# Patient Record
Sex: Female | Born: 1935 | Race: White | Hispanic: No | Marital: Married | Smoking: Never smoker
Health system: Southern US, Community
[De-identification: ages and names within clinical notes are randomized; demographics above are authoritative.]

## PROBLEM LIST (undated history)

## (undated) DIAGNOSIS — I10 Essential (primary) hypertension: Secondary | ICD-10-CM

## (undated) DIAGNOSIS — E079 Disorder of thyroid, unspecified: Secondary | ICD-10-CM

## (undated) DIAGNOSIS — M1990 Unspecified osteoarthritis, unspecified site: Secondary | ICD-10-CM

## (undated) DIAGNOSIS — K219 Gastro-esophageal reflux disease without esophagitis: Secondary | ICD-10-CM

## (undated) DIAGNOSIS — C801 Malignant (primary) neoplasm, unspecified: Secondary | ICD-10-CM

## (undated) HISTORY — DX: Malignant (primary) neoplasm, unspecified: C80.1

## (undated) HISTORY — DX: Gastro-esophageal reflux disease without esophagitis: K21.9

## (undated) HISTORY — DX: Essential (primary) hypertension: I10

## (undated) HISTORY — DX: Unspecified osteoarthritis, unspecified site: M19.90

## (undated) HISTORY — DX: Disorder of thyroid, unspecified: E07.9

---
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
## 1968-05-19 HISTORY — PX: ABDOMINAL HYSTERECTOMY: SHX81

## 1973-05-19 HISTORY — PX: ANTERIOR CERVICAL DISCECTOMY: SHX1160

## 1978-05-19 HISTORY — PX: THYROIDECTOMY: SHX17

## 1986-05-19 HISTORY — PX: CHOLECYSTECTOMY: SHX55

## 1994-05-19 HISTORY — PX: SHOULDER SURGERY: SHX246

## 1995-05-20 HISTORY — PX: MASTECTOMY: SHX3

## 1997-09-11 ENCOUNTER — Other Ambulatory Visit: Admission: RE | Admit: 1997-09-11 | Discharge: 1997-09-11 | Payer: Self-pay | Admitting: Family Medicine

## 1997-09-12 ENCOUNTER — Ambulatory Visit (HOSPITAL_COMMUNITY): Admission: RE | Admit: 1997-09-12 | Discharge: 1997-09-12 | Payer: Self-pay | Admitting: Family Medicine

## 1998-05-19 HISTORY — PX: BACK SURGERY: SHX140

## 1998-06-07 ENCOUNTER — Inpatient Hospital Stay (HOSPITAL_COMMUNITY): Admission: RE | Admit: 1998-06-07 | Discharge: 1998-06-11 | Payer: Self-pay | Admitting: Specialist

## 1998-06-07 ENCOUNTER — Encounter: Payer: Self-pay | Admitting: Specialist

## 1999-10-24 ENCOUNTER — Encounter: Admission: RE | Admit: 1999-10-24 | Discharge: 1999-10-24 | Payer: Self-pay | Admitting: Family Medicine

## 1999-10-24 ENCOUNTER — Encounter: Payer: Self-pay | Admitting: Family Medicine

## 2000-10-27 ENCOUNTER — Encounter: Admission: RE | Admit: 2000-10-27 | Discharge: 2000-10-27 | Payer: Self-pay | Admitting: General Surgery

## 2000-10-27 ENCOUNTER — Encounter: Payer: Self-pay | Admitting: General Surgery

## 2001-11-11 ENCOUNTER — Encounter: Admission: RE | Admit: 2001-11-11 | Discharge: 2001-11-11 | Payer: Self-pay | Admitting: Family Medicine

## 2001-11-11 ENCOUNTER — Encounter: Payer: Self-pay | Admitting: Family Medicine

## 2002-05-19 HISTORY — PX: CATARACT EXTRACTION: SUR2

## 2002-05-19 HISTORY — PX: TOTAL HIP ARTHROPLASTY: SHX124

## 2002-10-05 ENCOUNTER — Encounter: Payer: Self-pay | Admitting: Specialist

## 2002-10-19 ENCOUNTER — Inpatient Hospital Stay (HOSPITAL_COMMUNITY): Admission: RE | Admit: 2002-10-19 | Discharge: 2002-10-22 | Payer: Self-pay | Admitting: Specialist

## 2002-10-19 ENCOUNTER — Encounter: Payer: Self-pay | Admitting: Specialist

## 2002-10-22 ENCOUNTER — Inpatient Hospital Stay (HOSPITAL_COMMUNITY)
Admission: AD | Admit: 2002-10-22 | Discharge: 2002-10-28 | Payer: Self-pay | Admitting: Physical Medicine & Rehabilitation

## 2003-02-10 ENCOUNTER — Encounter: Admission: RE | Admit: 2003-02-10 | Discharge: 2003-02-10 | Payer: Self-pay | Admitting: Family Medicine

## 2003-02-10 ENCOUNTER — Encounter: Payer: Self-pay | Admitting: Family Medicine

## 2003-05-20 HISTORY — PX: TOTAL KNEE ARTHROPLASTY: SHX125

## 2004-01-19 ENCOUNTER — Inpatient Hospital Stay (HOSPITAL_COMMUNITY): Admission: RE | Admit: 2004-01-19 | Discharge: 2004-01-23 | Payer: Self-pay | Admitting: Specialist

## 2004-03-06 ENCOUNTER — Encounter: Admission: RE | Admit: 2004-03-06 | Discharge: 2004-03-06 | Payer: Self-pay | Admitting: General Surgery

## 2005-03-24 ENCOUNTER — Encounter: Admission: RE | Admit: 2005-03-24 | Discharge: 2005-03-24 | Payer: Self-pay | Admitting: General Surgery

## 2005-05-19 HISTORY — PX: TOTAL HIP ARTHROPLASTY: SHX124

## 2006-02-18 ENCOUNTER — Inpatient Hospital Stay (HOSPITAL_COMMUNITY): Admission: RE | Admit: 2006-02-18 | Discharge: 2006-02-21 | Payer: Self-pay | Admitting: Specialist

## 2006-04-07 ENCOUNTER — Encounter: Admission: RE | Admit: 2006-04-07 | Discharge: 2006-04-07 | Payer: Self-pay | Admitting: Family Medicine

## 2006-04-15 ENCOUNTER — Encounter: Admission: RE | Admit: 2006-04-15 | Discharge: 2006-04-15 | Payer: Self-pay | Admitting: General Surgery

## 2006-11-15 ENCOUNTER — Emergency Department (HOSPITAL_COMMUNITY): Admission: EM | Admit: 2006-11-15 | Discharge: 2006-11-15 | Payer: Self-pay | Admitting: Emergency Medicine

## 2007-03-23 ENCOUNTER — Encounter: Admission: RE | Admit: 2007-03-23 | Discharge: 2007-03-23 | Payer: Self-pay | Admitting: General Surgery

## 2007-04-05 ENCOUNTER — Encounter: Admission: RE | Admit: 2007-04-05 | Discharge: 2007-04-05 | Payer: Self-pay | Admitting: General Surgery

## 2007-09-27 ENCOUNTER — Encounter: Admission: RE | Admit: 2007-09-27 | Discharge: 2007-09-27 | Payer: Self-pay | Admitting: General Surgery

## 2008-04-21 ENCOUNTER — Encounter: Admission: RE | Admit: 2008-04-21 | Discharge: 2008-04-21 | Payer: Self-pay | Admitting: Family Medicine

## 2008-09-27 ENCOUNTER — Encounter: Admission: RE | Admit: 2008-09-27 | Discharge: 2008-09-27 | Payer: Self-pay | Admitting: General Surgery

## 2009-10-02 ENCOUNTER — Encounter: Admission: RE | Admit: 2009-10-02 | Discharge: 2009-10-02 | Payer: Self-pay | Admitting: General Surgery

## 2010-06-08 ENCOUNTER — Encounter: Payer: Self-pay | Admitting: Neurosurgery

## 2010-09-09 ENCOUNTER — Other Ambulatory Visit: Payer: Self-pay | Admitting: General Surgery

## 2010-09-09 DIAGNOSIS — Z1231 Encounter for screening mammogram for malignant neoplasm of breast: Secondary | ICD-10-CM

## 2010-09-09 DIAGNOSIS — Z9011 Acquired absence of right breast and nipple: Secondary | ICD-10-CM

## 2010-09-18 ENCOUNTER — Encounter (INDEPENDENT_AMBULATORY_CARE_PROVIDER_SITE_OTHER): Payer: Self-pay | Admitting: General Surgery

## 2010-10-04 NOTE — Discharge Summary (Signed)
NAMEZENORA, KARPEL               ACCOUNT NO.:  [id number]   MEDICAL RECORD NO.:  [id number]          PATIENT TYPE:  INP   LOCATION:  0460                         FACILITY:  [facility]   PHYSICIAN:  Jene Every, M.D.    DATE OF BIRTH:  1922/12/02   DATE OF ADMISSION:  01/19/2004  DATE OF DISCHARGE:  01/23/2004                                 DISCHARGE SUMMARY   ADMISSION DIAGNOSES:  1.  Degenerative joint disease left knee.  2.  Hypertension.  3.  Hypothyroidism.  4.  History of breast cancer.   DISCHARGE DIAGNOSES:  1.  Degenerative joint disease left knee, status post left total knee      arthroplasty.  2.  Hypertension.  3.  Hypothyroidism.  4.  History of breast cancer.   CONSULTS:  PT/OT.   PROCEDURES:  The patient was taken to the operating room on 01/19/2004 to  undergo a left total knee arthroplasty.   SURGEON:  Dr. Jene Every.   ASSISTANT:  Roma Schanz, P.A.   ANESTHESIA:  General.   COMPLICATIONS:  None.   HISTORY:  The patient is a 77 year old female with a longstanding history of  bilateral knee pain, with the left being slightly worse than the right.  She  has been treated for many years with cortisone injections but has noted  worsening of her symptoms.  This has become very disabling to her.  On exam,  she does have a varus deformity with pain with patellofemoral compression,  with a moderate effusion and loss of range of motion.  X-rays do demonstrate  end-stage osteoarthritis of the knee, with hypoplastic lateral femoral  condyle.  At this point, it was felt that the patient would benefit from a  total knee arthroplasty.  The risks and benefits of this surgery were  discussed with the patient.  The patient did obtain medical clearance from  Dr. Arvilla Market to proceed with the total knee on the left.   LABORATORY DATA:  Preoperative labs show white cell count 7.5, hemoglobin  15.6, hematocrit 46.9.  Routine H&Hs were followed throughout the  hospital  course.  At time of discharge, hemoglobin is 11.1, hematocrit 33.3.  Coagulation studies done preoperatively showed PT 12.5, INR 0.9, PTT 25.  Coagulation studies were followed throughout the hospital course.  At the  time of discharge, she had a PT of 18.3, INR 1.8.  Routine chemistries done  preoperatively showed sodium 139, potassium 3.8, glucose 98, with slightly  elevated BUN at 24.  Chemistries were followed throughout the hospital.  The  patient's sodium and potassium remained normalized.  At the time of  discharge, sodium is 141, potassium 3.5, with slightly elevated glucose of  105, with a normal BUN and creatinine function.  Routine liver function  tests  were within normal range.  A urinalysis done preoperatively was  negative.  Blood type is A negative.  Preoperative EKG showed incomplete  right bundle branch block, with a short PR interval, which noticeably  changed since the last tracing.  Preoperative chest x-ray shows chronic  obstructive pulmonary disease, with fibrosis in the  left base.  No active  findings were noted.   HOSPITAL COURSE:  The patient was taken to the O.R. to undergo the above-  stated procedure.  She underwent this without difficulty.  She was then  transferred to the PACU and then to the orthopedic floor for continued  postoperative care.  Postoperatively, the patient was placed on Coumadin for  DVT prophylaxis as well as PCA for analgesics.  The patient did very well  postoperatively, with no complications.  She advanced very well with her  physical therapy.  Incision remained clean and dry, without evidence of  infection.  Vital signs remained stable.  She remained afebrile.  Coumadin  was monitored per pharmacy.  Discharge planning was initiated.  The patient  was voiding and having bowel movements without significant incident.  On  postoperative day four, the patient was felt to be ready to be discharged  home.  She was walking 200 feet  with minimal assistance.  PT was 18.3, INR  1.8.  Hemoglobin stabilized at 11.1, hematocrit 33.3.  Discharge plan was  arranged with home health per [facility] as well as Coumadin monitoring.   DISPOSITION:  The patient will be discharged home with home health therapy  needs met.   The patient should have followup with Dr. Shelle Iron in approximately 10 days for  suture removal and x-rays.   DISCHARGE MEDICATIONS:  1.  Coumadin 3 mg daily.  2.  Vicodin p.r.n. pain.  3.  Robaxin p.r.n. spasm.   She can walk as tolerated using her knee immobilizer with total knee  precautions.  She is to change her dressing daily.  It is okay for her to  shower.   DIET:  As tolerated.   She will have her Coumadin monitored by her home health agency, with dose  adjusting then.   CONDITION ON DISCHARGE:  Stable.   FINAL DIAGNOSIS:  Status post left total knee arthroplasty.      CS/MEDQ  D:  02/07/2004  T:  02/08/2004  Job:  [id number]

## 2010-10-04 NOTE — H&P (Signed)
Stephanie [name], Stephanie [name] NO.:  [id number]   MEDICAL RECORD NO.:  [id number]          PATIENT TYPE:  INP   LOCATION:  NA                           FACILITY:  [facility]   PHYSICIAN:  Stephanie [name], M.D.    DATE OF BIRTH:  [birth year]   DATE OF ADMISSION:  02/18/2006  DATE OF DISCHARGE:                                HISTORY & PHYSICAL   CHIEF COMPLAINT:  Left hip pain.   HISTORY OF PRESENT ILLNESS:  Stephanie [name] is a pleasant 77 year old female  with a long standing history of left hip pain.  She has undergone  conservative treatment for many years and has now developed a severely  painful hip which is very disabling for her.  She has very painful range of  motion of the hip.  She does have quite a bit of leg length discrepancy on  the left.  It is felt at this point she would benefit from a left total hip  arthroplasty.  The risks and benefits of this were discussed with the  patient and she does wish to proceed.   PAST MEDICAL HISTORY:  1. Hypertension.  2. Hypothyroidism.   CURRENT MEDICATIONS:  1. Omothyroid 30 mg two p.o. daily.  2. Hydrochlorothiazide 25 mg one p.o. daily.  3. Restatin eye drops, one each eye b.i.d.  4. Tums p.r.n.   PAST SURGICAL HISTORY:  1. Appendectomy.  2. Elbow repair.  3. Hysterectomy.  4. Anterior cervical fusion.  5. Thyroidectomy.  6. Cholecystectomy.  7. Shoulder repair.  8. Right mastectomy.  9. Lumbar fusion using Ray cage.  10.Right total hip arthroplasty.  11.Left total knee replacement.  12.Cataract surgery, both eyes.   ALLERGIES:  None.   SOCIAL HISTORY:  The patient is married.  She is retired.  History is  negative for alcohol or tobacco consumption.  Husband will be her caregiver.  He is currently being treated for cancer himself.   PRIMARY CARE PHYSICIAN:  Stephanie [name], M.D.   FAMILY HISTORY:  Mother and father had history of significant coronary  artery disease as well as cancer.  Both are deceased.   Mother deceased at  age 51.  Father at 66.   REVIEW OF SYSTEMS:  GENERAL:  The patient denies any fevers, chills, night  sweats or bleeding tendencies.  CNS:  No blurred, double vision, seizure,  headache or paralysis.  RESPIRATORY:  No shortness of breath, productive  cough or hemoptysis.  CARDIOVASCULAR:  No chest pain, angina or orthopnea.  GENITOURINARY:  No dysuria, hematuria or discharge.  The patient does have a  history of chronic urinary tract infections.  GASTROINTESTINAL:  The patient  does have chronic diarrhea.  No nausea or vomiting or constipation is noted.   PHYSICAL EXAMINATION:  VITAL SIGNS:  Pulse 68, respirations 20, blood  pressure 150/78 in the left arm.  GENERAL APPEARANCE:  This is an elderly female sitting upright in no acute  distress.  HEENT:  Normocephalic, atraumatic.  Pupils equal, round, reactive to light.  Extraocular movements intact.  NECK:  Supple.  No lymphadenopathy.  CHEST:  Clear  to auscultation bilaterally.  No rales, rhonchi or wheezing.  BREAST/GENITOURINARY:  Not examined, not pertinent to history of present  illness.  HEART:  Regular rate and rhythm with no murmurs, rubs or gallops.  ABDOMEN:  Soft, nontender, nondistended. Bowel sounds x4.  SKIN: No rashes or lesions are noted.  EXTREMITIES:  The patient does have painful range of motion of the left hip,  is shortened approximately 1-1/2 inches.  She does have significant  scoliosis as well.   IMPRESSION:  Degenerative joint disease left hip.   PLAN:  The patient will be admitted to [facility] to undergo a  left total hip replacement.      Stephanie [name], P.A.      Stephanie [name], M.D.  Electronically Signed    CS/MEDQ  D:  02/16/2006  T:  02/16/2006  Job:  [id number]   cc:   Stephanie [name], M.D.  Fax: [phone]

## 2010-10-04 NOTE — Op Note (Signed)
NAME:  Stephanie [name], Stephanie [name]                 ACCOUNT NO.:  [id number]   MEDICAL RECORD NO.:  [id number]                   PATIENT TYPE:  INP   LOCATION:  X005                                 FACILITY:  [facility]   PHYSICIAN:  Jene Every, M.D.                 DATE OF BIRTH:  Oct 11, 1922   DATE OF PROCEDURE:  10/19/2002  DATE OF DISCHARGE:                                 OPERATIVE REPORT   PREOPERATIVE DIAGNOSIS:  Degenerative joint disease, right hip.   POSTOPERATIVE DIAGNOSIS:  Degenerative joint disease, right hip.   OPERATION PERFORMED:  Right total hip arthroplasty.   SURGEON:  Jene Every, M.D.   ASSISTANTMarland Kitchen  Montez Morita.   ANESTHESIA:  Spinal.   INDICATIONS FOR PROCEDURE:  The patient is a 77 year old with end stage  osteoarthritis of the hip with severe pain.  Wheelchair bound.  X-ray  indicated osteoarthritis.  Operative intervention is indicated for  replacement.  The risks and benefits were discussed including bleeding,  infection, damage to neurovascular structures, leg length discrepancy,  dislocation, hardware failure, etc.   DESCRIPTION OF PROCEDURE:  The patient was placed in supine position.  After  adequate induction of general anesthesia and 1g Kefzol she was placed in  left lateral decubitus position. All bony prominences were well padded.  The  right hip and greater trochanteric region and right lower extremity was  prepped and draped in the usual sterile fashion.  First a curvilinear  incision was made over the trochanter.  Subcutaneous tissues were dissected.  Electrocautery utilized to achieve hemostasis.  Fascia lata identified and  divided in line with the skin incision.  Self-retaining retractors were  placed.  Incised the fascia lata, performed an adductor tenotomy.  I  identified the piriformis and external rotators, detached them, tagged them  and mobilized them __________ protecting the sciatic nerve at all times.  Next, we identified the capsule,  T-shaped capsulotomy, tagged the leaflets,  dislocated the hip, severe osteoporotic changes of the femoral head were  noted.  Oscillating saw utilized to perform an osteotomy one fingerbreadth  above the lesser trochanter.  In the appropriate version and then  sequentially reamed to a 13.5 mm diameter after using an initiator and  lateralizing it in the appropriate version.  Then I palpated with a curet  inside the canal and it was found to be inside the canal without broaching  the cortex.  I broached this up to a 13.5 mm standard on appropriate version  and used a calcar reamer to ream this in the appropriate fashion.  Next,  this was removed and copiously irrigated.  We placed retractors carefully  around the acetabulum, excised the remaining labrum, removed osteophytes  superiorly and medially.  Curetted the acetabulum from the fovea,  sequentially reamed to a 53.  Good cancellous bone to the base of the fovea.  Good rim fit.  Appropriate version.  We placed a trial cup with liner, trial  femoral  component.  Excellent version, excellent stability, good range.  We  removed these and I placed a permanent Duraloc porous coated cup, packed in  the appropriate version and abduction and anteversion.  Excellent purchase  to the acetabulum. We were able to move the pelvis with the attachment  device without moving the prosthesis.  The holes indicate it was bottomed  out.  I then irrigated copiously, placed a 10 degree posterolateral liner,  packed that into place.  I also performed anterior release of the capsule  off the femur.  Next, we used a Prodigy 14.5 mm fully porous coated.  We  impacted it into the appropriate version.  This had excellent purchase.  Trialed the heads and a +5 was found to be the appropriate length for  stability, impacted the +5 under the Morris taper after cleaning the  trunnion.  The hip was found to be stable throughout a full range of motion.  We tried an extension  with external rotation as well as flexion and internal  rotation.  Excellent coverage and stability was noted.  There was a few  millimeters of play with longitudinal traction applied to it.  Next, we  copiously irrigated and inspected with no evidence of active bleeding.  I  repaired the leaflets of the capsule with #1 Vicryl in figure-of-eight  sutures.  Repaired the adductor tenotomy with the same, repaired the  external rotators as well as the fascia lata with #1 Vicryl in figure-of-  eight sutures.  Subcutaneous tissue reapproximated with 2 Vicryl simple  sutures.  Skin was reapproximated with staples. The wound was dressed  sterilely.  She was placed supine on the hospital bed in an abduction pillow  and leg lengths indicate a longer length on the right due to the filling up  of the acetabulum.  Good pulses were noted postoperatively and good  capillary refill.  The patient tolerated the procedure well.  There were no  complications.  Blood loss .                                               Jene Every, M.D.    Cordelia Pen  D:  10/19/2002  T:  10/19/2002  Job:  [id number]

## 2010-10-04 NOTE — Discharge Summary (Signed)
NAME:  Stephanie [name], Stephanie [name]                 ACCOUNT NO.:  [id number]   MEDICAL RECORD NO.:  [id number]                   PATIENT TYPE:  IPS   LOCATION:  4151                                 FACILITY:  [facility]   PHYSICIAN:  Ranelle Oyster, M.D.             DATE OF BIRTH:  [birth year]   DATE OF ADMISSION:  10/22/2002  DATE OF DISCHARGE:  10/28/2002                                 DISCHARGE SUMMARY   DISCHARGE DIAGNOSES:  1. Right total hip replacement.  2. Leukocytosis, resolved.  3. Diarrhea questionably secondary to irritable bowel syndrome.   Followed by Dr. Arvilla Market and Dr. Shelle Iron.   HISTORY OF PRESENT ILLNESS:  [name] is a 77 year old female with  history of DJD with progressive right hip pain secondary to DJD.  She  elected to undergo right total hip replacement June 2, by Dr. Shelle Iron.  Postoperative weightbearing and on Coumadin for DVT prophylaxis.  Postsurgery, patient was noted to have problems with diarrhea and  leukocytosis, potassium dropping down to 3.7.  Hypokalemia was supplemented,  and the patient was started on Avalox for questionable infection.  Physical  therapy has been ongoing, and patient is noted to be at moderate assistance  for transfers, minimum assistance ambulating 50 feet x2.   PAST MEDICAL HISTORY:  1. ACDS.  2. __________.  3. Thyroidectomy.  4. Right mastectomy.  5. Excision of bilateral cataracts.  6. Hysterectomy.  7. BSO.  8. Appendectomy.  9. __________.  10.      Cholecystectomy.  11.      Hypertension.  12.      Glaucoma.  13.      Frequent UTIs.  14.      Intermittent diarrhea.   ALLERGIES:  No known drug allergies   SOCIAL HISTORY:  The patient is married, lives in an one level home with  four steps at entry.  Was independent prior to admission.  She does not use  any tobacco or alcohol.   HOSPITAL COURSE:  The patient was admitted to rehabilitation on October 22, 2002, for inpatient therapy to consist of PT, OT daily.   Postadmission, she  was maintained on Coumadin for DVT prophylaxis.  The patient reported  diarrhea much improved.  She also seemed anxious and stressed.  She was  noted to have problems with diarrhea.  Stool was sent for C. difficile  check, and this was negative.  The patient was started on Librax b.i.d., and  this improved GI symptoms greatly.   Laboratories drawn postadmission showed hemoglobin 9.7, hematocrit 28.2,  white count 6.4, platelets 299.  Sodium 137, potassium 3.8, chloride 100,  CO2 26, BUN 21, creatinine 1.2, glucose 113.   The patient was taken off Avalox on June 7, and has been afebrile.  Her  incision seemed to be healing well without any signs or symptoms of  infection.  No drainage noted.  Staples remained intact at the time of  discharge, and the  patient is to follow up with Dr. Shelle Iron for staple  removal.   Since being in rehabilitation, [name]'s progress was noted to be  modified independent for upper body care, modified independent for lower  body care, modified independent for toileting.  The patient was modified  independent for transfers, modified independent ambulating 80 feet with  rolling walker on touchdown basis.  She is supervision for shower transfers  or bathing secondary to difficulty maintaining touchdown weightbearing for  this activity.  Further followup therapy to include home health PT, OT by  [facility].  Home health RN was arranged to draw [employer]  with [facility] to follow Coumadin through November 18, 2002.  On October 28, 2002, the patient was discharged to home.   DISCHARGE MEDICATIONS:  1. Librax 5 mg b.i.d.  2. Coumadin 2 mg daily.  3. Amicar 70 mcg daily.  4. Timoptic 0.25 % one gtt daily.  5. Robaxin 500 mg q.i.d. p.r.n. spasm.  6. Oxycodone (IR) 5-10 mg q.4-6 h. p.r.n. pain.  7. Trinsicon one p.o. b.i.d.   ACTIVITY:  Touchdown weightbearing on right lower extremity with walker.   WOUND CARE:  Keep  area clean and dry.   SPECIAL INSTRUCTIONS:  No alcohol, no smoking, no driving.  [facility] to provide PT, OT, RN, with RN to draw next [employer] on October 31, 2002.   FOLLOWUP:  The patient is to follow up with Dr. Shelle Iron and Dr. Arvilla Market for  appointment in 2-3 weeks, follow up with Dr. Riley Kill as needed.     Evlyn Kanner., [place], P.A.-C              Ranelle Oyster, M.D.    PSP/MEDQ  D:  11/10/2002  T:  11/11/2002  Job:  [id number]

## 2010-10-04 NOTE — H&P (Signed)
NAME:  Stephanie [name], Stephanie [name]                 ACCOUNT NO.:  [id number]   MEDICAL RECORD NO.:  [id number]                   PATIENT TYPE:  INP   LOCATION:  0457                                 [facility]ACILITY[facility]  [facility]   PHYSICIAN:  Jene Every, M.D.                 DATE OF BIRTH:  [birth year]   DATE OF ADMISSION:  10/19/2002  DATE OF DISCHARGE:  10/22/2002                                HISTORY & PHYSICAL   CHIEF COMPLAINT:  Right hip pain.   HISTORY:  The patient is a 77 year old female with end-stage osteoarthritis  of the hip with severe pain which is disabling in nature.  She is wheelchair  bound.  X-rays indicated severe osteoarthritis.  Due to the fact that the  patient's pain is disabling in nature and she is not getting significant  relief with conservative measures, it is felt that she will benefit from a  total hip arthroplasty.  Risks and benefits of the surgery were discussed  with the patient by Jene Every, M.D. and she wishes to proceed.   PAST MEDICAL HISTORY:  1. Gastroesophageal reflux disease.  2. Hypertension.  3. Hypothyroidism.   CURRENT MEDICATIONS:  1. Axid.  2. HCTZ.  3. Thyroid medication.  4. Ambien.  5. Betimol.  6. Hydrocodone p.r.n.  7. Mobic 50 mg daily.   PAST SURGICAL HISTORY:  1. Cervical fusion.  2. Thyroidectomy.  3. Bilateral cataract surgery.  4. Left rotator cuff repair.  5. Left elbow surgery.  6. Cholecystectomy.  7. Hysterectomy.  8. Ray cage placement in lumbar spine.  9. Right nephrectomy.  10.      Appendectomy.   SOCIAL HISTORY:  The patient denies any illicit drug use.  No alcohol  intake.  No tobacco intake.   REVIEW OF SYSTEMS:  GENERAL:  The patient denies fevers, chills, night  sweats, or bleeding tendencies.  CNS:  No blurred or double vision, seizure,  headache, or paralysis.  RESPIRATORY:  No shortness of breath, productive  cough, or hemoptysis.  CARDIOVASCULAR:  No chest pain, angina, orthopnea.  GENITOURINARY:   No dysuria, hematuria, or discharge.  GASTROINTESTINAL:  No  nausea, vomiting, diarrhea, constipation, bloody stools.  MUSCULOSKELETAL:  Pertinent to HPI.   PHYSICAL EXAMINATION:  This is not actually done by me, but taken from the  health history assessment and physical examination performed by Jene Every, M.D.  VITAL SIGNS:  Temperature 97.3, pulse 68, respirations 16, blood pressure  138/60, height 5 feet 2 inches.  GENERAL:  This is a well-developed, well-nourished 77 year old female in no  acute distress.  HEENT:  Normocephalic, atraumatic.  Pupils are equal, round, and reactive to  light.  NECK:  Supple.  No lymphadenopathy.  CHEST:  Clear to auscultation bilaterally.  No rhonchi, wheezes, or rales.  HEART:  Regular rate and rhythm without murmur, rub, or gallop.  ABDOMEN:  Soft, nontender, nondistended.  Bowel sounds x4.  EXTREMITIES:  The patient does have  decreased internal/external rotation of  the right hip.  _____ pulses 1+ bilaterally.  She does have pain with right  hip range of motion.   IMPRESSION:  1. Degenerative joint disease right hip.  2. Hypertension.  3. Hypothyroidism.  4. Gastroesophageal reflux disease.   PLAN:  The patient will be admitted to [facility] and undergo  right total hip arthroplasty by Jene Every, M.D.     Roma Schanz, P.A.                   Jene Every, M.D.    CS/MEDQ  D:  11/11/2002  T:  11/11/2002  Job:  [id number]

## 2010-10-04 NOTE — Discharge Summary (Signed)
NAMEKRYSTALYNN, [name] NO.:  [id number]   MEDICAL RECORD NO.:  [id number]          PATIENT TYPE:  INP   LOCATION:  1514                         FACILITY:  [facility]   PHYSICIAN:  Jene Every, M.D.    DATE OF BIRTH:  1922-09-25   DATE OF ADMISSION:  02/18/2006  DATE OF DISCHARGE:  02/21/2006                                 DISCHARGE SUMMARY   ADMISSION DIAGNOSES:  1. Degenerative disc disease, left hip.  2. Hypertension.  3. Hypothyroidism.   DISCHARGE DIAGNOSES:  1. Degenerative disc disease, left hip.  2. Hypertension.  3. Hypothyroidism.  4. Status post left total hip.  5. Resolved postoperative blood loss anemia.  6. Resolved hypokalemia and hyponatremia.   HISTORY:  Stephanie [name] is a pleasant 77 year old female with longstanding  history of left hip pain.  Unfortunately, it has progressed to the point  where it is very disabling for her.  She is in significant pain with range  of motion of the hip as well as shortening.  It is felt at this point that  she would benefit from a total hip arthroplasty.  The risks and benefits of  the surgery were discussed with the patient.  She does wish to proceed.   PROCEDURE:  The patient did undergo a left total hip arthroplasty on October  3.  Surgeon Dr. Jene Every.  Assistant Ranee Gosselin.  Complications  none.  Blood loss is 1000 mL.  She was transfused one unit intraoperatively.   CONSULTATIONS:  PT, OT, case management.   LABORATORY DATA:  Preoperative CBC showed a white cell count of 7.4,  hemoglobin 14.9, hematocrit 43.4.  These were followed throughout the  hospital course.  Postoperatively, her hemoglobin did drop to a low level of  8.9 with hematocrit 26.5.  However, this did improve during the hospital  stay.  She was transfused one unit of packed red blood cells  intraoperatively.  At time of discharge, the hemoglobin was 10, hematocrit  29.2.  The patient's white cell count was elevated on  postoperative day #2  to a high of 15.3.  At discharge, it was down to 11.4.  Coagulation studies  done preoperatively showed a PT of 13.8, INR 1.  The patient was placed on  Coumadin.  These were monitored.  At time of discharge, INR was 1.9.  Routine chemistries done preoperatively showed a normal sodium 139,  potassium 3.5, glucose of 104 with a slightly elevated creatinine of 1.4.  These were monitored throughout the hospital course.  The sodium did drop to  a level of 133, potassium 3.1.  Glucose rose to as high as 134.  Renal  function remained stable.  She did have a slight rise in her BUN to 24 with  a decrease in creatinine to 1.2 at time of discharge.  Sodium and potassium  did normalize.  At time of discharge, sodium was 137, potassium 3.3.  Urinalysis done preoperatively showed trace leukocyte esterase, 3-6 WBCs  noted per high power field with hyaline casts.  This was repeated  postoperatively which did show still trace leukocyte  esterase, only 0-2 WBCs  seen per high power field.  Hyaline casts were continued to be noted.  Again, urinalysis repeated on postoperative day #2 and this was negative.  Blood type A-.  Urine culture obtained showed 10-15,000 colonies of staph  species.   Preoperative EKG showed sinus rhythm with short PR, RSR, QR pattern in V1,  question right ventricular conduction delay.  This was reviewed.  Repeat EKG  showed similar finding.  Chest x-ray showed on active cardiopulmonary  disease.  Chronic changes were noted.   HOSPITAL COURSE:  The patient was admitted, taken to the OR and underwent  the above-stated procedure.  She did have fairly significant blood loss  intraoperatively.  She was transfused one unit of packed red blood cells.  She was transferred to the PACU and then to the orthopedic floor for  continued postoperative care.  On postoperative day #1, the patient was  doing fairly well.  Pain was well-controlled.  She denied any headache,   dizziness or chest pain.  Vital signs were stable.  She was placed on  Coumadin.  Hemoglobin was down to a level of 8.9, hematocrit 26.5.  Also had  decrease in her sodium and potassium.  However, blood was drawn out of the  IV arm secondary to no blood draws on the opposite side due to mastectomy.  We did a request a H&H be repeated at noon.  Dr. Shelle Iron did authorize a foot  stick.  We adjusted her IV fluids to accommodate for the hyponatremia.  She  was also supplemented for her hypokalemia.  Discharge planning was  initiated.  The patient was discontinued from PCA as well as the Foley.  The  patient did fairly well.  Postoperative day #2, she was ambulating without  significant difficulty.  Repeat hemoglobin was stable.  No transfusion was  warranted.  She felt much better on postoperative day #2.  She was voiding  without difficulty.  She denied any urinary symptoms.  However, white cell  count did pop up to a level of 15.3.  We did go ahead and repeat her  urinalysis with culture and sensitivity.  However, this did come back  negative.  Dressing was changed.  She did have slight hematoma along the  incision.  Compartments were soft, however.  Pedal pulses were equal, calves  soft and nontender.  IV was placed at [facility]  Postoperative day #3, the  patient was doing much better.  White cell count had decreased.  It was felt  she was stable to be discharged to home.  She was anxious to go home  secondary to her husband being by himself.  He does have a slight touch of  Alzheimer's dementia.  Therefore, we did elect to release her at that time.   DISPOSITION:  Patient stable to be discharged home with PT and OT as well as  RN for Coumadin management.   FOLLOWUP:  She will follow up with Dr. Shelle Iron in approximately 10-14 days for  x-ray and suture removal.   WOUND CARE:  To change her dressing daily and advise [place] with any increasing  erythema or drainage develop.  ACTIVITY:  She is to walk  with assistance.  No driving for 2-6 weeks.  Use  walker.  She is to be partial weightbearing to the left lower extremity.   DIET:  She can resume her regular home diet.  She should use total hip  precautions.   DISCHARGE MEDICATIONS:  Include  all home medications as well as Vicodin  p.r.n. pain, vitamin C 500 mg, Coumadin dose as per pharmacy.   CONDITION ON DISCHARGE:  Stable and improved.   FINAL DIAGNOSIS:  Status post left total hip arthroplasty.      Roma Schanz, P.A.      Jene Every, M.D.  Electronically Signed    CS/MEDQ  D:  03/04/2006  T:  03/05/2006  Job:  [id number]

## 2010-10-04 NOTE — H&P (Signed)
NAME:  Stephanie [name], Stephanie [name]                 ACCOUNT NO.:  [id number]   MEDICAL RECORD NO.:  [id number]                   PATIENT TYPE:  INP   LOCATION:  NA                                   [facility]ACILITY:  [facility]   PHYSICIAN:  Jene Every, M.D.                 DATE OF BIRTH:  02-12-1923   DATE OF ADMISSION:  01/19/2004  DATE OF DISCHARGE:                                HISTORY & PHYSICAL   CHIEF COMPLAINT:  Left knee pain.   HISTORY:  [name] is a 77 year old female with a longstanding history  of bilateral knee pain. She has been treated in the past with Cortizone  injection; however, she states that her pain pattern has become very  disabling.  On exam, she does have varus deformity, she does patellofemoral  compression, mild to moderate effusion ranges -5 to 130.  X-rays do  demonstrate osteoarthritis of the knee with hypoplastic lateral femoral  condyle and slight varus deformity and so at this point, the patient would  benefit from a total knee arthroplasty. The risks and benefits of the  surgery were discussed with the patient by Jene Every, M.D. and she  wishes to proceed.   PAST MEDICAL HISTORY:  Significant for hypothyroidism, hypertension, history  of breast cancer.   CURRENT MEDICATIONS:  1. HCTZ 25 mg 1 p.o. daily.  2. Armour thyroid 90 mg 1 p.o. daily.  3. Ambien 5 mg 1 p.o. q.h.s.  4. Vicodin p.r.n. pain.  5. Tylenol p.r.n. pain.   ALLERGIES:  None.   PAST SURGICAL HISTORY:  1. Appendectomy.  2. Hysterectomy.  3. Anterior cervical fusion.  4. Thyroidectomy.  5. Cholecystectomy.  6. Mastectomy on the right.  7. __________ reconstruction.  8. Lumbar spine surgery and placement of Raytec device.  9. Cataract removal bilaterally.  10.      Total hip arthroplasty on the right.   SOCIAL HISTORY:  The patient is married, she is retired, she does not smoke  or drink alcohol. She has no children. Her husband will be her caregiver  following surgery. She  lives in a one story home with five steps to the  entrance.   FAMILY HISTORY:  Mother and family history of coronary artery disease as  well as cancer.   REVIEW OF SYMPTOMS:  GENERAL:  The patient denies any fever, chills or night  sweats or bleeding tendencies.  CNS:  No blurred or double vision, seizure,  headache or paralysis. RESPIRATORY:  No shortness of breath, productive  cough or hemoptysis. CARDIOVASCULAR:  No chest pain, angina or orthopnea.  GU:  No dysuria, hematuria or discharge. GI:  No nausea, vomiting, diarrhea,  constipation, melena or bloody stools.  MUSCULOSKELETAL:  Pertinent to HPI.   PHYSICAL EXAMINATION:  VITAL SIGNS:  Pulse 76, respiratory rate 16, blood  pressure 150/90.  GENERAL:  This s a well-developed, well-nourished, 77 year old female  sitting upright on the table in no acute distress.  HEENT:  Atraumatic, normocephalic.  Pupils equal round and reactive to  light. EOM's intact.  NECK:  Supple, no lymphadenopathy.  CHEST:  Clear to auscultation bilaterally, no rhonchi, wheezes or rales.  BREAST/GU:  Not examined, not pertinent to HPI.  HEART:  Regular rate and rhythm without murmur, gallop or rub.  ABDOMEN:  Soft, nontender, nondistended, bowel sounds x4.  SKIN:  No rashes or lesions are noted.  EXTREMITIES:  Ranges -5 to 130. She does have pain with patellofemoral  compression with a varus deformity, mild to moderate effusion is noted.  X-  rays do reveal degenerative joint disease of the left knee.   PLAN:  Left total knee arthroplasty. The patient did receive medical  clearance from Dr. Arvilla Market.     Roma Schanz, P.A.                   Jene Every, M.D.    CS/MEDQ  D:  01/15/2004  T:  01/15/2004  Job:  [id number]

## 2010-10-04 NOTE — Op Note (Signed)
NAME:  Stephanie [name], Stephanie [name]                 ACCOUNT NO.:  [id number]   MEDICAL RECORD NO.:  [id number]                   PATIENT TYPE:  INP   LOCATION:  X003                                 FACILITY:  [facility]   PHYSICIAN:  Jene Every, M.D.                 DATE OF BIRTH:  04/20/1923   DATE OF PROCEDURE:  01/19/2004  DATE OF DISCHARGE:                                 OPERATIVE REPORT   PREOPERATIVE DIAGNOSIS:  Degenerative joint disease, left knee.   POSTOPERATIVE DIAGNOSIS:  Degenerative joint disease, left knee.   PROCEDURE PERFORMED:  Left total knee arthroplasty.   ANESTHESIA:  General.   SURGEON:  Javier Docker, M.D.   ASSISTANT:  Roma Schanz, PA-C   BRIEF HISTORY AND INDICATIONS:  This is a 77 year old with refractory knee  pain, osteoarthrosis, end-stage, as noted of the knee.  Operative  intervention was indicated for replacement.  Risks and benefits discussed  including bleeding, infection, damage to neurovascular structures, DVT, PE,  suboptimal range of motion, need for revision, etc.   TECHNIQUE:  The patient in supine position.  After induction of adequate  general anesthesia, the left lower extremity was prepped and draped and  exsanguinated in the usual sterile fashion.  Thigh tourniquet inflated to  350 mmHg.  A midline incision was made in the patella, and median  parapatellar arthrotomy was performed.  Superficial medial collateral  ligament was elevated.  Patella femoral ligament was divided.  The knee was  then flexed.  Remnants of the medial meniscus and lateral meniscus were  removed.  The ACL was removed.  Tricompartmental osteoarthrosis was noted.  Step-drill entered the femoral canal, and this was irrigated 10-degree left.  Intramedullary guide 10 mm from the distal femur was cut with an oscillating  saw, was incised to a 7, utilizing the guide, pinning the appropriate  external rotation.  We then performed the anterior, posterior, and  chamfer  cuts with a Kocher protecting the posterior elements at all times.  Next we  performed the patellofemoral groove then the box cut with the posterior  cruciate sacrificing replacement.  Then subluxed the tibia, removed the  tibial spine with an oscillating saw, step-guide, entered the tibial canal.  Intramedullary guide placed.  Then 6 mm off the medial defect.  The  appropriate version and external alignment guide medial to the tibial  tubercle, anterior to the tibia bisecting the malleoli.  Posterior elements  well protected, the oscillating saw utilized to perform the tibial cut.  Zero degree was utilized.  Next, I placed a trial femur 7 and 7 tibia, 15  insert, full extension, full flexion, no lift-off.  Rotation was marked  appropriately.  With an external alignment guide, again with the appropriate  version.  Knee was then flexed and the baseplate applied to the tibia  pinned, and the punch guide was utilized for the fins.  Following this, the  patella was trialed to a 26.  This was reamed 10 mm in depth.  We had 12 mm  left over thickness which was appropriate.  Peg holes reamed as well.  Next,  we looked at flexion and extension, equal flexion and extension gap.  With  the trials, there was full extension, full flexion, no lift-off, good  stability in varus and valgus stressing, 0-30 degrees.  We removed all  instrumentation; pulsatile lavage was delivered to the joint.  Loose debris  was removed.  The knee was then flexed, the tibia subluxed, __________ was  placed, dried with a sponge, Gelfoam placed in the intramedullary guide as a  cement plug.  Cement was mixed in the appropriate fashion, delivered into  the tibial canal after partial curing, cemented the tibial component,  redundant cement removed.  The femoral component was then inserted,  excellent fit, redundant cement removed.  Cement was on the posterior  runners.  Placed a trial 15 with a peg, reduced it, axial  load applied  throughout the curing of the cement.  Redundant cement removed.  We clamped  a 26 mm patella, and redundant cement removed.  After the appropriate curing  of the cement, removed the trials and redundant cement removed.  Again, the  15 was the optimal insert.  We placed the 15 insert after removing the  redundant cement, transfixed it, excellent purchase, no lift-off.  Good  flexion and good extension, no instability.  The wound copiously irrigated.  Bleeding cauterized, the geniculate vessels posteriorly.  Next, placed a  Hemovac and brought out the lateral stab wound in the skin, the patellar  arthrotomy repaired with #1 Vicryl interrupted figure-of-eight sutures.  Subcutaneous tissue reapproximated with 2-0 Vicryl simple sutures.  Skin was  reapproximated with staples.  Wound was dressed sterilely, secured with  compression dressing.  The tourniquet was deflated, and there was adequate  revascularization of the lower extremity appreciated.   The patient tolerated the procedure well with no complications.  Tourniquet  time was 1 hour and 30 minutes.                                               Jene Every, M.D.    Cordelia Pen  D:  01/19/2004  T:  01/20/2004  Job:  [id number]

## 2010-10-04 NOTE — Op Note (Signed)
[name], [name] NO.:  [id number]   MEDICAL RECORD NO.:  [id number]          PATIENT TYPE:  INP   LOCATION:  0001                         FACILITY:  [facility]   PHYSICIAN:  Jene Every, M.D.    DATE OF BIRTH:  [birth year]   DATE OF PROCEDURE:  02/18/2006  DATE OF DISCHARGE:                                 OPERATIVE REPORT   PREOPERATIVE DIAGNOSIS:  Degenerative joint disease, left hip.   POSTOPERATIVE DIAGNOSIS:  Degenerative joint disease, left hip.   PROCEDURE PERFORMED:  Left total hip arthroplasty.   ANESTHESIA:  Spinal.   ASSISTANT:  Georges Lynch. Gioffre, M.D.   COMPONENTS UTILIZED:  1. DePuy Prodigy 50 mm diameter wide.  2. A 54 acetabular cup Pinnacle with two screw fixations.  3. Acetabular liner and a 54 mm OD, +4 10 degree liner 36 mm +5 head.   BRIEF HISTORY OF THE CASE:  An 77 year old with refractory end-stage  osteoporosis of the hip.  Operative intervention is indicated for  replacement of the degenerated joint.  She has significant collapse of the  head with shortening of the leg.  Operative intervention is indicated for  replacement of the degenerated hip and to restore some of her leg length.  The risks and benefits have been discussed including bleeding, infection,  damage to nerves or vessels, DVT, PE, dislocation, need for revision.  No  changes in leg length, etc.   DESCRIPTION OF PROCEDURE:  The patient was placed in the supine position.  After the induction of adequate general anesthesia and one gram of Kefzol  she was placed in the right lateral decubitus position and all bony  prominences were well-padded.  The left hip and peritrochanteric region and  left lower extremity was prepped and draped in the usual sterile fashion.  Just prior to this the hip holders were utilized.  The well leg was flexed  and padded.  A posterolateral approach was utilized.  The incision was based  over the trochanter, subcutaneous tissue was  dissected, electrocautery  utilized to  achieve hemostasis.  The fascia lata identified and divided  along the line of the skin incision.  Adductor tenotomy was performed.  The  piriformis was identified, detached from its origin, reflected posteriorly  to protect the sciatic nerve.  We detached the external rotators and incised  the capsule again protecting the neural elements.  Dislocated the hip, a  fairly short neck.  We performed an osteotomy just below the femoral head.  We then entered the femoral canal with a T-handle reamer and a box chisel  and sequentially reamed, first to a 13.5 mm and then we used a broach and a  calcar reamer, but we found that the 13.5 wide broach was still loose.  We  reamed, therefore, up to a 14.5 and then used a 15 wide broach which seemed  to fit satisfactorily.   Then attention was turned towards the acetabulum.  We curetted the  acetabulum, removed any residual cartilage.  There was severe osteoporosis  noted.  We then sequentially reamed in a 45 20 degree inclination  from a 46  to a 53 mm diameter with good cortical bone and good bleeding tissue noted  into the subcortical bone with a fair amount of bloody oozing just from the  bone.  We, therefore, had her typed and crossed.  Electrocautery had been  utilized to achieve hemostasis. We put a trial acetabular cup 54, trial  femoral component of 15, and then a +5.  We reduced her with a 10 degree  lip.  We felt that was satisfactory, although we could use an additional 2  mm.  We, therefore, removed all the prosthetic components.  We then used a  permanent DuraLoc cup 54 mm diameter, good coverage, and impacted it in  place with excellent coverage and we seated it well.  We placed two screws  in the superior and posterior part of the cup and drilled, filled it  posteriorly and put a 15 and a 20 mm screw with excellent purchase.  We then  put a cap into the base of the cup with still some oozing from  the  cancellous bone.  We, therefore, put some bone wax in the remaining screw  hole.  We then put a 10 degree +4 liner and impacted it into place. No  bleeding, oozing following that.  We then copiously irrigated as we did  prior to placement of the acetabular component and we put in a 50 mm  Prodigy, impacted it into place with excellent purchase, slightly broad at 2  mm.  I felt that was satisfactory though to gain some length due to her leg  length discrepancy.  We trialed a 0 and then a +5 head.  The +5 head was  noted to be appropriate and satisfactory.  We, therefore, impacted a  permanent 36 +5 head.  After cleaning the trunnion we impacted it onto the  trunnion.  We reduced it.  She was stable throughout full range of motion.  Good flexion and internal rotation without dislocation.  Leg lengths were  better following this.  No dislocation was noted.  It was felt to be stable.   The wound copiously irrigated with antibiotic irrigation.  No evidence of  active bleeding.  I repaired the adductor tenotomy with #1 Vicryl in  interrupted figure-of-eight sutures.  The fascia lata repaired with #1  Vicryl interrupted figure-of-eight sutures.  The subcutaneous tissue  reapproximated with 2-0 Vicryl simple sutures.  The skin was reapproximated  with staples.  Wound was dressed sterilely,  placed on the hospital bed.  Leg lengths were checked and she had near equivalent leg lengths, good  pulses.  Placed on an abductor pillow and returned to the recovery room in  satisfactory condition.   The patient tolerated the procedure well with no complications.  Received  one unit of red blood cells intraoperatively.  She had 1000 mL of blood  loss.      Jene Every, M.D.  Electronically Signed     JB/MEDQ  D:  02/18/2006  T:  02/19/2006  Job:  [id number]

## 2010-10-04 NOTE — Discharge Summary (Signed)
NAME:  Stephanie [name], Stephanie [name]                 ACCOUNT NO.:  [id number]   MEDICAL RECORD NO.:  [id number]                   PATIENT TYPE:  INP   LOCATION:  0457                                 FACILITY:  [facility]   PHYSICIAN:  Jene Every, M.D.                 DATE OF BIRTH:  02-Nov-1922   DATE OF ADMISSION:  10/19/2002  DATE OF DISCHARGE:  10/22/2002                                 DISCHARGE SUMMARY   ADMISSION DIAGNOSES:  1. Degenerative joint disease, right hip.  2. Hypothyroidism.  3. Hypertension.  4. Gastroesophageal reflux disease.   DISCHARGE DIAGNOSES:  1. Degenerative joint disease, right hip, status post right total hip     arthroplasty.  2. Hypertension.  3. Gastroesophageal reflux disease.  4. Hypothyroidism.  5. Resolved hypokalemia and leukocytosis.   HISTORY:  Stephanie [name] is a 77 year old female, who has a longstanding  history of right hip pain, and it has gotten to the point where it is  severely debilitating.  The patient is wheelchair-bound at this point and is  unable to weightbear on her hip.  The patient does have decreased internal  and external rotation of her right hip with painful range of motion.  X-rays  reveal severe degenerative joint disease of the right hip, and it was felt  at this time that the patient would benefit from a total hip arthroplasty.  The risks and benefits of the surgery were discussed with the patient in  detail, and she wishes to proceed.   PROCEDURE NOTE:  The patient was taken to the OR on October 19, 2002, to undergo  a right total hip arthroplasty by Jene Every, M.D.  Assistant Stephanie [name], M.D.  Anesthesia was spinal.   CONSULTS:  PT/OT, rehab, as well as hospitalist.   LABORATORY DATA:  Preadmission labs showed a white blood cell count of 7.9,  hemoglobin 13.8, hematocrit 41.4.  Serial CBCs were followed throughout the  hospital course.  The patient did have slight leukocytosis of 20.8; however,  this resolved at  time of discharge to rehab to 17.9.  She did have a drop in  her hemoglobin to 9.7, hematocrit 38.1.  PT/INR at time of admission:  PT/2.3, INR of 1.0.  At time of discharge, PT was 20.2, INR of 1.9.  Routine  chemistries were done throughout the hospital course.  Admission sodium was  141, potassium 3.7, slightly elevated BUN at 28.  Routine chemistries were  followed.  The patient did have a drop in her potassium to the level of 2.5;  however, at time of discharge, this had resolved to normal at 3.5.  BUN  normalized at time of discharge to 16.  The patient did have slight  hyponatremia to a level of 133; at discharge, normalized at 136.  Routine  liver functions were performed preoperatively and were normal.  TSH is  1.252.  Urinalysis done prior to admission showed trace leukocyte esterase.  At  time of discharge, repeat urinalysis was performed which also showed  trace leukocyte esterase with 0-2 WBCs seen on microscopic exam.  Blood type  is A negative.  Urine cultures taken prior to discharge show no growth.  Admission EKG showed incomplete right bundle-branch block.  Chest x-ray done  preoperatively showed mild cardiomegaly with a previous right mastectomy, no  evidence of active disease in the chest.  X-rays of the hip showed severe  degenerative hypotrophic arthritis on the right.   HOSPITAL COURSE:  The patient was admitted to the hospital and underwent the  above-stated procedure without difficulty.  She was then transferred to the  PACU and then to the orthopedic floor for continued postoperative care.  Coumadin was initiated for DVT prophylaxis.  On postoperative day #1, the  patient was doing fairly well.  Vital signs were stable.  She was afebrile.  However, she did have a drop in her potassium to a level of 2.5, a drop in  her hemoglobin 11.5, hematocrit 33.8.  At that point, PT/OT were consulted  as well as hospitalist to manage her medical issues.  Hospitalist did follow   throughout the hospital course, managing the patient's multiple medical  issues as well as her hypokalemia and postoperative anemia.  Postoperative  day #2, the patient continued to do fairly well.  She did have an elevated  WBC but remained afebrile.  Her hypokalemia had resolved, and a urinalysis  was checked with cultures and sensitivities which came back without evidence  of infection.  Care management became involved with this patient for  discharge planning.  Rehab was consulted.  Postoperative day #3, the patient  had stabilized with a normal potassium.  Hospitalist felt she was ready for  discharge to rehabilitation.  The patient was doing well from an orthopedic  standpoint.  Hemoglobin at discharge was 9.7, hematocrit 28.3; however, she  remained asymptomatic.  Incision was clean and dry without evidence of  infection.  The patient was discharged to rehab with total hip protocol.  She will continue with Coumadin.  Medical issues will be managed by the  rehab team.   DISCHARGE MEDICATIONS:  Resume home medications as well as Percocet and  Robaxin as needed for pain in leg.  Coumadin per pharmacy.  Patient was  partial weightbearing at 50.   CONDITION ON DISCHARGE:  Stable.   FINAL DIAGNOSES:  Status post right total hip arthroplasty.  The patient  will have followup appointment with Jene Every, M.D. in 10-14 days.     Roma Schanz, P.A.                   Jene Every, M.D.    CS/MEDQ  D:  11/11/2002  T:  11/11/2002  Job:  [id number]

## 2010-10-08 ENCOUNTER — Ambulatory Visit
Admission: RE | Admit: 2010-10-08 | Discharge: 2010-10-08 | Disposition: A | Discharge status: Home / Self Care | Payer: Medicare Other | Source: Ambulatory Visit | Attending: General Surgery | Admitting: General Surgery

## 2010-10-08 DIAGNOSIS — Z1231 Encounter for screening mammogram for malignant neoplasm of breast: Secondary | ICD-10-CM

## 2010-10-08 DIAGNOSIS — Z9011 Acquired absence of right breast and nipple: Secondary | ICD-10-CM

## 2011-06-23 ENCOUNTER — Ambulatory Visit (INDEPENDENT_AMBULATORY_CARE_PROVIDER_SITE_OTHER): Payer: Medicare Other | Admitting: General Surgery

## 2011-06-23 ENCOUNTER — Encounter (INDEPENDENT_AMBULATORY_CARE_PROVIDER_SITE_OTHER): Payer: Self-pay | Admitting: General Surgery

## 2011-06-23 VITALS — BP 112/74 | HR 72 | Temp 97.1°F | Resp 16 | Ht 62.0 in | Wt 104.2 lb

## 2011-06-23 DIAGNOSIS — Z853 Personal history of malignant neoplasm of breast: Secondary | ICD-10-CM

## 2011-06-23 NOTE — Patient Instructions (Signed)

## 2011-06-23 NOTE — Progress Notes (Signed)
Subjective:     Patient ID: Stephanie [name], female   DOB: Jun 05, 1922, 78 y.o.   MRN: [id number]  HPI This is an 78 year old female who has been followed annually after a right mastectomy and axillary node dissection by Dr. Earlene Plater about 15 years ago. She completed 5 years of tamoxifen. Over the last year she really has had no hospitalizations or any health problems. She comes in today for a breast exam. She had a normal mammogram in April of last year and it will follow up with another mammogram in May. She reports no problems referable to her breasts.  Review of Systems DIGITAL LEFT SCREENING MAMMOGRAM WITH CAD:  The breast tissue is heterogeneously dense. No masses or malignant type calcifications are  identified. Compared with prior studies.  Images were processed with CAD.  IMPRESSION:  No specific mammographic evidence of malignancy. Next screening mammogram is recommended in one  year.  A result letter of this screening mammogram will be mailed directly to the patient.  ASSESSMENT: Negative - BI-RADS 1     Objective:   Physical Exam  Vitals reviewed. Neck: Neck supple.  Pulmonary/Chest: Right breast exhibits no mass and no skin change. Left breast exhibits no inverted nipple, no mass, no nipple discharge, no skin change and no tenderness.    Lymphadenopathy:    She has no cervical adenopathy.    She has no axillary adenopathy.       Right: No supraclavicular adenopathy present.       Left: No supraclavicular adenopathy present.       Assessment:     History right breast cancer    Plan:     She has no clinical evidence of recurrence. Her last mammogram is negative and she has another one coming up soon. She's going to continue her own exams, get her mammogram is scheduled. I told her that if Dr. Clelia Croft would be willing to follow her with an annual exam that will be fine otherwise an I would be happy to follow her up annually for an exam.

## 2011-09-15 ENCOUNTER — Other Ambulatory Visit (INDEPENDENT_AMBULATORY_CARE_PROVIDER_SITE_OTHER): Payer: Self-pay | Admitting: General Surgery

## 2011-09-15 DIAGNOSIS — Z9011 Acquired absence of right breast and nipple: Secondary | ICD-10-CM

## 2011-09-15 DIAGNOSIS — Z1231 Encounter for screening mammogram for malignant neoplasm of breast: Secondary | ICD-10-CM

## 2011-10-17 ENCOUNTER — Ambulatory Visit
Admission: RE | Admit: 2011-10-17 | Discharge: 2011-10-17 | Disposition: A | Discharge status: Home / Self Care | Payer: Medicare Other | Source: Ambulatory Visit | Attending: General Surgery | Admitting: General Surgery

## 2011-10-17 DIAGNOSIS — Z9011 Acquired absence of right breast and nipple: Secondary | ICD-10-CM

## 2011-10-17 DIAGNOSIS — Z1231 Encounter for screening mammogram for malignant neoplasm of breast: Secondary | ICD-10-CM

## 2012-09-13 ENCOUNTER — Other Ambulatory Visit (INDEPENDENT_AMBULATORY_CARE_PROVIDER_SITE_OTHER): Payer: Self-pay | Admitting: General Surgery

## 2012-09-13 DIAGNOSIS — Z1231 Encounter for screening mammogram for malignant neoplasm of breast: Secondary | ICD-10-CM

## 2012-10-19 ENCOUNTER — Ambulatory Visit
Admission: RE | Admit: 2012-10-19 | Discharge: 2012-10-19 | Disposition: A | Discharge status: Home / Self Care | Payer: Medicare Other | Source: Ambulatory Visit | Attending: General Surgery | Admitting: General Surgery

## 2012-10-19 DIAGNOSIS — Z1231 Encounter for screening mammogram for malignant neoplasm of breast: Secondary | ICD-10-CM

## 2013-12-05 ENCOUNTER — Other Ambulatory Visit: Payer: Self-pay

## 2013-12-05 DIAGNOSIS — Z1231 Encounter for screening mammogram for malignant neoplasm of breast: Secondary | ICD-10-CM

## 2013-12-21 ENCOUNTER — Ambulatory Visit
Admission: RE | Admit: 2013-12-21 | Discharge: 2013-12-21 | Disposition: A | Discharge status: Home / Self Care | Payer: Medicare Other | Source: Ambulatory Visit

## 2013-12-21 DIAGNOSIS — Z1231 Encounter for screening mammogram for malignant neoplasm of breast: Secondary | ICD-10-CM

## 2014-06-26 ENCOUNTER — Inpatient Hospital Stay (HOSPITAL_COMMUNITY)
Admission: EM | Admit: 2014-06-26 | Discharge: 2014-07-03 | DRG: 082 | Disposition: A | Discharge status: Hospice | Payer: Medicare Other | Attending: Internal Medicine | Admitting: Internal Medicine

## 2014-06-26 ENCOUNTER — Emergency Department (HOSPITAL_COMMUNITY): Payer: Medicare Other

## 2014-06-26 ENCOUNTER — Encounter (HOSPITAL_COMMUNITY): Payer: Self-pay | Admitting: Emergency Medicine

## 2014-06-26 DIAGNOSIS — Y92481 Parking lot as the place of occurrence of the external cause: Secondary | ICD-10-CM | POA: Diagnosis not present

## 2014-06-26 DIAGNOSIS — Z9011 Acquired absence of right breast and nipple: Secondary | ICD-10-CM | POA: Diagnosis not present

## 2014-06-26 DIAGNOSIS — R296 Repeated falls: Secondary | ICD-10-CM | POA: Diagnosis not present

## 2014-06-26 DIAGNOSIS — Z8249 Family history of ischemic heart disease and other diseases of the circulatory system: Secondary | ICD-10-CM

## 2014-06-26 DIAGNOSIS — N189 Chronic kidney disease, unspecified: Secondary | ICD-10-CM | POA: Diagnosis present

## 2014-06-26 DIAGNOSIS — I6200 Nontraumatic subdural hemorrhage, unspecified: Secondary | ICD-10-CM

## 2014-06-26 DIAGNOSIS — E039 Hypothyroidism, unspecified: Secondary | ICD-10-CM | POA: Diagnosis present

## 2014-06-26 DIAGNOSIS — K219 Gastro-esophageal reflux disease without esophagitis: Secondary | ICD-10-CM | POA: Diagnosis present

## 2014-06-26 DIAGNOSIS — Z681 Body mass index (BMI) 19 or less, adult: Secondary | ICD-10-CM | POA: Diagnosis not present

## 2014-06-26 DIAGNOSIS — Z515 Encounter for palliative care: Secondary | ICD-10-CM | POA: Diagnosis not present

## 2014-06-26 DIAGNOSIS — I129 Hypertensive chronic kidney disease with stage 1 through stage 4 chronic kidney disease, or unspecified chronic kidney disease: Secondary | ICD-10-CM | POA: Diagnosis present

## 2014-06-26 DIAGNOSIS — R4701 Aphasia: Secondary | ICD-10-CM | POA: Diagnosis not present

## 2014-06-26 DIAGNOSIS — J439 Emphysema, unspecified: Secondary | ICD-10-CM | POA: Diagnosis present

## 2014-06-26 DIAGNOSIS — Z853 Personal history of malignant neoplasm of breast: Secondary | ICD-10-CM | POA: Diagnosis not present

## 2014-06-26 DIAGNOSIS — R451 Restlessness and agitation: Secondary | ICD-10-CM | POA: Diagnosis present

## 2014-06-26 DIAGNOSIS — W1830XA Fall on same level, unspecified, initial encounter: Secondary | ICD-10-CM | POA: Diagnosis present

## 2014-06-26 DIAGNOSIS — Z96649 Presence of unspecified artificial hip joint: Secondary | ICD-10-CM | POA: Diagnosis present

## 2014-06-26 DIAGNOSIS — I482 Chronic atrial fibrillation, unspecified: Secondary | ICD-10-CM | POA: Diagnosis present

## 2014-06-26 DIAGNOSIS — Z96659 Presence of unspecified artificial knee joint: Secondary | ICD-10-CM | POA: Diagnosis not present

## 2014-06-26 DIAGNOSIS — N183 Chronic kidney disease, stage 3 (moderate): Secondary | ICD-10-CM | POA: Diagnosis present

## 2014-06-26 DIAGNOSIS — N179 Acute kidney failure, unspecified: Secondary | ICD-10-CM | POA: Diagnosis present

## 2014-06-26 DIAGNOSIS — J189 Pneumonia, unspecified organism: Secondary | ICD-10-CM | POA: Diagnosis present

## 2014-06-26 DIAGNOSIS — S0083XA Contusion of other part of head, initial encounter: Secondary | ICD-10-CM | POA: Diagnosis present

## 2014-06-26 DIAGNOSIS — M810 Age-related osteoporosis without current pathological fracture: Secondary | ICD-10-CM | POA: Diagnosis not present

## 2014-06-26 DIAGNOSIS — R54 Age-related physical debility: Secondary | ICD-10-CM | POA: Diagnosis not present

## 2014-06-26 DIAGNOSIS — I4891 Unspecified atrial fibrillation: Secondary | ICD-10-CM | POA: Diagnosis present

## 2014-06-26 DIAGNOSIS — S065XAA Traumatic subdural hemorrhage with loss of consciousness status unknown, initial encounter: Secondary | ICD-10-CM | POA: Diagnosis present

## 2014-06-26 DIAGNOSIS — E876 Hypokalemia: Secondary | ICD-10-CM | POA: Diagnosis present

## 2014-06-26 DIAGNOSIS — I10 Essential (primary) hypertension: Secondary | ICD-10-CM | POA: Diagnosis present

## 2014-06-26 DIAGNOSIS — G9340 Encephalopathy, unspecified: Secondary | ICD-10-CM | POA: Diagnosis not present

## 2014-06-26 DIAGNOSIS — S0990XA Unspecified injury of head, initial encounter: Secondary | ICD-10-CM | POA: Diagnosis present

## 2014-06-26 DIAGNOSIS — W19XXXA Unspecified fall, initial encounter: Secondary | ICD-10-CM

## 2014-06-26 DIAGNOSIS — S065X9A Traumatic subdural hemorrhage with loss of consciousness of unspecified duration, initial encounter: Secondary | ICD-10-CM | POA: Diagnosis present

## 2014-06-26 DIAGNOSIS — R64 Cachexia: Secondary | ICD-10-CM | POA: Diagnosis not present

## 2014-06-26 DIAGNOSIS — E038 Other specified hypothyroidism: Secondary | ICD-10-CM | POA: Diagnosis present

## 2014-06-26 LAB — URINALYSIS, ROUTINE W REFLEX MICROSCOPIC
Bilirubin Urine: NEGATIVE
GLUCOSE, UA: NEGATIVE mg/dL
Hgb urine dipstick: NEGATIVE
KETONES UR: NEGATIVE mg/dL
Leukocytes, UA: NEGATIVE
Nitrite: NEGATIVE
PROTEIN: 30 mg/dL — AB
Specific Gravity, Urine: 1.011 (ref 1.005–1.030)
UROBILINOGEN UA: 1 mg/dL (ref 0.0–1.0)
pH: 6.5 (ref 5.0–8.0)

## 2014-06-26 LAB — PROTIME-INR
INR: 1.18 (ref 0.00–1.49)
PROTHROMBIN TIME: 15.2 s (ref 11.6–15.2)

## 2014-06-26 LAB — I-STAT TROPONIN, ED: Troponin i, poc: 0.02 ng/mL (ref 0.00–0.08)

## 2014-06-26 LAB — URINE MICROSCOPIC-ADD ON

## 2014-06-26 LAB — CBC WITH DIFFERENTIAL/PLATELET
BASOS ABS: 0 10*3/uL (ref 0.0–0.1)
Basophils Relative: 0 % (ref 0–1)
EOS PCT: 2 % (ref 0–5)
Eosinophils Absolute: 0.2 10*3/uL (ref 0.0–0.7)
HCT: 39 % (ref 36.0–46.0)
Hemoglobin: 12.9 g/dL (ref 12.0–15.0)
LYMPHS ABS: 0.8 10*3/uL (ref 0.7–4.0)
LYMPHS PCT: 10 % — AB (ref 12–46)
MCH: 29.5 pg (ref 26.0–34.0)
MCHC: 33.1 g/dL (ref 30.0–36.0)
MCV: 89.2 fL (ref 78.0–100.0)
MONOS PCT: 7 % (ref 3–12)
Monocytes Absolute: 0.6 10*3/uL (ref 0.1–1.0)
NEUTROS PCT: 81 % — AB (ref 43–77)
Neutro Abs: 6.8 10*3/uL (ref 1.7–7.7)
PLATELETS: 217 10*3/uL (ref 150–400)
RBC: 4.37 MIL/uL (ref 3.87–5.11)
RDW: 14.1 % (ref 11.5–15.5)
WBC: 8.4 10*3/uL (ref 4.0–10.5)

## 2014-06-26 LAB — BASIC METABOLIC PANEL
Anion gap: 11 (ref 5–15)
BUN: 23 mg/dL (ref 6–23)
CHLORIDE: 106 mmol/L (ref 96–112)
CO2: 25 mmol/L (ref 19–32)
Calcium: 8.7 mg/dL (ref 8.4–10.5)
Creatinine, Ser: 1.44 mg/dL — ABNORMAL HIGH (ref 0.50–1.10)
GFR calc non Af Amer: 31 mL/min — ABNORMAL LOW (ref >90)
GFR, EST AFRICAN AMERICAN: 36 mL/min — AB (ref >90)
Glucose, Bld: 96 mg/dL (ref 70–99)
Potassium: 3.5 mmol/L (ref 3.5–5.1)
Sodium: 142 mmol/L (ref 135–145)

## 2014-06-26 MED ORDER — LORAZEPAM 2 MG/ML IJ SOLN
0.5000 mg | Freq: Once | INTRAMUSCULAR | Status: AC
  Administered 2014-06-27: 0.5 mg via INTRAVENOUS
  Filled 2014-06-26: qty 1

## 2014-06-26 MED ORDER — METOPROLOL TARTRATE 1 MG/ML IV SOLN
2.5000 mg | Freq: Once | INTRAVENOUS | Status: AC
  Administered 2014-06-27: 2.5 mg via INTRAVENOUS
  Filled 2014-06-26: qty 5

## 2014-06-26 NOTE — ED Notes (Signed)
Pt transported to xray 

## 2014-06-26 NOTE — ED Provider Notes (Signed)
CSN: [id number]     Arrival date & time 06/26/14  34 History   First MD Initiated Contact with Patient 06/26/14 1711     No chief complaint on file.    (Consider location/radiation/quality/duration/timing/severity/associated sxs/prior Treatment) HPI  Stephanie [name] is a 81 y.o. female with PMH as below who comes to the ED after ground level fall in parking lot. Incident occurred 30 minutes ago.  Prehospital care included none available. Details of incident include: EMS reports patient was found in a parking lot with a large hematoma to the right side of her face. She was only able to tell them her name and did not remember the fall. EMS reports patient apparently went to the pharmacy earlier in the day. Patient unable to tell whether or not she is on blood thinners what her prescription list does not have any. Patient not complaining of anything at this time. History is limited as she is only alert to self.     Past Medical History  Diagnosis Date  . Arthritis   . Cancer     rt breast  . GERD (gastroesophageal reflux disease)   . Hypertension   . Osteoporosis   . Thyroid disease     hypothyroidism   Past Surgical History  Procedure Laterality Date  . Mastectomy  1997    right breast  . Appendectomy  1942  . Abdominal hysterectomy  1970  . Anterior cervical discectomy  1975  . Thyroidectomy  1980  . Cholecystectomy  1988  . Shoulder surgery  1996  . Back surgery  2000    ray TFC device  . Total hip arthroplasty  2004  . Cataract extraction  2004    bilateral  . Elbow surgery  1955  . Total knee arthroplasty  2005  . Total hip arthroplasty  2007   Family History  Problem Relation Age of Onset  . Cancer Mother     uterine  . Heart disease Mother   . Cancer Father     brain  . Heart disease Father    History  Substance Use Topics  . Smoking status: Never Smoker   . Smokeless tobacco: Not on file  . Alcohol Use: No   OB History    No data available      Review of Systems Unable to obtain secondary to patient condition   Allergies  Review of patient's allergies indicates no known allergies.  Home Medications   Prior to Admission medications   Medication Sig Start Date End Date Taking? Authorizing Provider  hydrochlorothiazide 25 MG tablet Take 25 mg by mouth daily.      Historical Provider, MD  latanoprost (XALATAN) 0.005 % ophthalmic solution Place 1 drop into both eyes at bedtime.      Historical Provider, MD  levothyroxine (SYNTHROID, LEVOTHROID) 75 MCG tablet Take 75 mcg by mouth daily.      Historical Provider, MD   BP 175/96 mmHg  Pulse 106  Temp(Src) 98.6 F (37 C) (Oral)  Resp 16  Ht 5\' 2"  (1.575 m)  Wt 100 lb (45.36 kg)  BMI 18.29 kg/m2  SpO2 91% Physical Exam General: awake. AAOxperson only. Cachectic elderly fem in NAD HENT: Large R sided facial hematoma which appears to have 2 small abrasions overlying. no palpable skull defect; pupils 3 mm, equal, round, reactive; EOMs intact. No signs of ocular entrapment, Battle sign, raccoon eyes, nasal septal hematoma, hemotympanum. No midface instability but midface is difficult to examine 2/2 to very  large hematoma causing asymmetry. No abnormal dentition or other apparent oral injury. Neck: supple, trachea midline, cervical collar in place, no midline C spine ttp Cardio: RRR.  No JVD.  2+ pulses in bilateral upper and lower extremities. No peripheral edema. Pulm:   CTAB, no r/r/g. Normal respiratory effort Chest wall: stable to AP/LAT compression, chest wall non-tender, no obvious clavicle deformity Abd: soft, NT/ND. MSK: Hips stable to lateral compression. Abrasion and swelling to R lateral knee. FROM of R knee. Extremities o/w atraumatic, NVI.  Spine: without obvious step off, tenderness or signs of injury.  Neuro: GCS 14. No focal deficit. Normal strength/sensation/muscle tone.    ED Course  Procedures (including critical care time) Labs Review Labs Reviewed   URINALYSIS, ROUTINE W REFLEX MICROSCOPIC - Abnormal; Notable for the following:    Protein, ur 30 (*)    All other components within normal limits  CBC WITH DIFFERENTIAL/PLATELET - Abnormal; Notable for the following:    Neutrophils Relative % 81 (*)    Lymphocytes Relative 10 (*)    All other components within normal limits  BASIC METABOLIC PANEL - Abnormal; Notable for the following:    Creatinine, Ser 1.44 (*)    GFR calc non Af Amer 31 (*)    GFR calc Af Amer 36 (*)    All other components within normal limits  URINE MICROSCOPIC-ADD ON - Abnormal; Notable for the following:    Casts HYALINE CASTS (*)    All other components within normal limits  PROTIME-INR  I-STAT TROPOININ, ED    Imaging Review Ct Head Wo Contrast  06/26/2014   CLINICAL DATA:  Unwitnessed fall. Hematoma to the right side of face. Confusion.  EXAM: CT HEAD WITHOUT CONTRAST  CT MAXILLOFACIAL WITHOUT CONTRAST  CT CERVICAL SPINE WITHOUT CONTRAST  TECHNIQUE: Multidetector CT imaging of the head, cervical spine, and maxillofacial structures were performed using the standard protocol without intravenous contrast. Multiplanar CT image reconstructions of the cervical spine and maxillofacial structures were also generated.  COMPARISON:  None.  FINDINGS: CT HEAD FINDINGS  Diffuse cerebral atrophy. Mild ventricular dilatation consistent with central atrophy. Low-attenuation changes in the deep white matter consistent with small vessel ischemia. There is an acute right frontoparietal subdural hematoma. Maximal depth measures about 10 mm. Focal acute hematoma also demonstrated in the left posterior temporal region measuring 1.3 x 2.3 cm. This appears to be extra-axial and probably also represents a subdural hematoma. No definite intraparenchymal or intraventricular hemorrhage. No mass effect or midline shift. Gray-white matter junctions are distinct. Basal cisterns are not effaced. Mastoid air cells are not opacified. Vascular  calcifications. Calvarium appears intact. No displaced skull fractures.  CT MAXILLOFACIAL FINDINGS  Large soft tissue hematoma over the right side of the face extending from the infraorbital region down along the maxillary region and over the anterior mandible. Air-fluid level in the right maxillary antrum probably represents hemorrhage. Small retention cyst in the sphenoid sinus. Paranasal sinuses are otherwise clear. Globes and extraocular muscles appear intact and symmetrical. The orbital rims, nasal bones, maxillary antral walls, zygomatic arches, pterygoid plates, mandibles, and temporomandibular joints appear intact. No displaced fractures are identified. Multiple dental caries. Periapical lucencies around bilateral molars consistent with periodontal disease. Degenerative changes in the temporomandibular joints.  CT CERVICAL SPINE FINDINGS  Degenerative changes throughout the cervical spine with narrowed cervical interspaces and endplate hypertrophic changes throughout. Degenerative changes throughout the cervical facet joints and at C1-2 level. Coalition of the C5-C6 vertebral bodies, likely congenital. There is slight  anterior subluxation of C2 on C3 and of C7 on T1. This likely represents degenerative change although ligamentous injury is not excluded. No prevertebral soft tissue swelling. No vertebral compression deformities. Normal alignment of the facet joints. No focal bone lesion or bone destruction. Bone cortex and trabecular architecture appears intact. Vascular calcifications.  IMPRESSION: Bilateral acute subdural hematomas, greater on the right. No significant mass effect or midline shift. Chronic atrophy and small vessel ischemic changes.  Large soft tissue hematoma over the right side of the face. Air-fluid level in the right maxillary antrum. No displaced orbital or facial fractures identified.  Degenerative changes throughout the cervical spine. Slight anterior subluxation of C2 on C3 and C7 on  T1, likely degenerative but ligamentous injury not excluded. No displaced fractures identified.  These results were called by telephone at the time of interpretation on 06/26/2014 at 5:57 pm to Dr. Ezequiel Essex , who verbally acknowledged these results.   Electronically Signed   By: Lucienne Capers M.D.   On: 06/26/2014 18:00   Ct Cervical Spine Wo Contrast  06/26/2014   CLINICAL DATA:  Unwitnessed fall. Hematoma to the right side of face. Confusion.  EXAM: CT HEAD WITHOUT CONTRAST  CT MAXILLOFACIAL WITHOUT CONTRAST  CT CERVICAL SPINE WITHOUT CONTRAST  TECHNIQUE: Multidetector CT imaging of the head, cervical spine, and maxillofacial structures were performed using the standard protocol without intravenous contrast. Multiplanar CT image reconstructions of the cervical spine and maxillofacial structures were also generated.  COMPARISON:  None.  FINDINGS: CT HEAD FINDINGS  Diffuse cerebral atrophy. Mild ventricular dilatation consistent with central atrophy. Low-attenuation changes in the deep white matter consistent with small vessel ischemia. There is an acute right frontoparietal subdural hematoma. Maximal depth measures about 10 mm. Focal acute hematoma also demonstrated in the left posterior temporal region measuring 1.3 x 2.3 cm. This appears to be extra-axial and probably also represents a subdural hematoma. No definite intraparenchymal or intraventricular hemorrhage. No mass effect or midline shift. Gray-white matter junctions are distinct. Basal cisterns are not effaced. Mastoid air cells are not opacified. Vascular calcifications. Calvarium appears intact. No displaced skull fractures.  CT MAXILLOFACIAL FINDINGS  Large soft tissue hematoma over the right side of the face extending from the infraorbital region down along the maxillary region and over the anterior mandible. Air-fluid level in the right maxillary antrum probably represents hemorrhage. Small retention cyst in the sphenoid sinus. Paranasal  sinuses are otherwise clear. Globes and extraocular muscles appear intact and symmetrical. The orbital rims, nasal bones, maxillary antral walls, zygomatic arches, pterygoid plates, mandibles, and temporomandibular joints appear intact. No displaced fractures are identified. Multiple dental caries. Periapical lucencies around bilateral molars consistent with periodontal disease. Degenerative changes in the temporomandibular joints.  CT CERVICAL SPINE FINDINGS  Degenerative changes throughout the cervical spine with narrowed cervical interspaces and endplate hypertrophic changes throughout. Degenerative changes throughout the cervical facet joints and at C1-2 level. Coalition of the C5-C6 vertebral bodies, likely congenital. There is slight anterior subluxation of C2 on C3 and of C7 on T1. This likely represents degenerative change although ligamentous injury is not excluded. No prevertebral soft tissue swelling. No vertebral compression deformities. Normal alignment of the facet joints. No focal bone lesion or bone destruction. Bone cortex and trabecular architecture appears intact. Vascular calcifications.  IMPRESSION: Bilateral acute subdural hematomas, greater on the right. No significant mass effect or midline shift. Chronic atrophy and small vessel ischemic changes.  Large soft tissue hematoma over the right side of the face. Air-fluid  level in the right maxillary antrum. No displaced orbital or facial fractures identified.  Degenerative changes throughout the cervical spine. Slight anterior subluxation of C2 on C3 and C7 on T1, likely degenerative but ligamentous injury not excluded. No displaced fractures identified.  These results were called by telephone at the time of interpretation on 06/26/2014 at 5:57 pm to Dr. Ezequiel Essex , who verbally acknowledged these results.   Electronically Signed   By: Lucienne Capers M.D.   On: 06/26/2014 18:00   Dg Pelvis Portable  06/26/2014   CLINICAL DATA:  Trauma  patient. Fall tonight. No pelvic complaints. Mostly facial trauma.  EXAM: PORTABLE PELVIS 1-2 VIEWS  COMPARISON:  CT abdomen and pelvis 04/07/2006  FINDINGS: Bilateral total hip arthroplasties are incompletely included within the field of view. The entire femoral prosthesis are not evaluated. Visualized portions appear intact. Pelvis appears intact without evidence of acute fracture or dislocation. Postoperative changes at the lumbosacral interspace. Vascular calcifications.  IMPRESSION: Postoperative changes. No evidence of acute fracture or dislocation of the pelvis.   Electronically Signed   By: Lucienne Capers M.D.   On: 06/26/2014 17:30   Dg Chest Portable 1 View  06/26/2014   CLINICAL DATA:  Patient fell.  Confusion  EXAM: PORTABLE CHEST - 1 VIEW  COMPARISON:  May 28, 2011  FINDINGS: There is left lower lobe consolidation with small left effusion. There is underlying emphysema. Elsewhere lungs are clear. Heart is mildly enlarged with pulmonary vascularity within normal limits. No pneumothorax. No fractures are appreciable. Patient is status post right mastectomy with surgical clips in right axillary region. 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  IMPRESSION: Left lower lobe consolidation with small left effusion. Underlying emphysema. No fractures are apparent. No pneumothorax.   Electronically Signed   By: Lowella Grip M.D.   On: 06/26/2014 17:29   Dg Knee Complete 4 Views Right  06/26/2014   CLINICAL DATA:  Fall today 06/26/2014.  RIGHT knee pain.  EXAM: RIGHT KNEE - COMPLETE 4+ VIEW  COMPARISON:  None.  FINDINGS: The patient was unable to be adequately positioned for standard views. Nonstandard views degrade the study. Allowing for degradation of the study, there is no fracture or acute osseous abnormality. Medial and patellofemoral compartment predominant osteoarthritis is present. Chondrocalcinosis of the menisci. Atherosclerosis. The alignment of the knee appears anatomic. No  effusion.  IMPRESSION: No acute osseous abnormality. Medial and patellofemoral compartment osteoarthritis.   Electronically Signed   By: Dereck Ligas M.D.   On: 06/26/2014 19:24   Ct Maxillofacial Wo Cm  06/26/2014   CLINICAL DATA:  Unwitnessed fall. Hematoma to the right side of face. Confusion.  EXAM: CT HEAD WITHOUT CONTRAST  CT MAXILLOFACIAL WITHOUT CONTRAST  CT CERVICAL SPINE WITHOUT CONTRAST  TECHNIQUE: Multidetector CT imaging of the head, cervical spine, and maxillofacial structures were performed using the standard protocol without intravenous contrast. Multiplanar CT image reconstructions of the cervical spine and maxillofacial structures were also generated.  COMPARISON:  None.  FINDINGS: CT HEAD FINDINGS  Diffuse cerebral atrophy. Mild ventricular dilatation consistent with central atrophy. Low-attenuation changes in the deep white matter consistent with small vessel ischemia. There is an acute right frontoparietal subdural hematoma. Maximal depth measures about 10 mm. Focal acute hematoma also demonstrated in the left posterior temporal region measuring 1.3 x 2.3 cm. This appears to be extra-axial and probably also represents a subdural hematoma. No definite intraparenchymal or intraventricular hemorrhage. No mass effect or midline shift. Gray-white matter junctions are distinct.  Basal cisterns are not effaced. Mastoid air cells are not opacified. Vascular calcifications. Calvarium appears intact. No displaced skull fractures.  CT MAXILLOFACIAL FINDINGS  Large soft tissue hematoma over the right side of the face extending from the infraorbital region down along the maxillary region and over the anterior mandible. Air-fluid level in the right maxillary antrum probably represents hemorrhage. Small retention cyst in the sphenoid sinus. Paranasal sinuses are otherwise clear. Globes and extraocular muscles appear intact and symmetrical. The orbital rims, nasal bones, maxillary antral walls, zygomatic  arches, pterygoid plates, mandibles, and temporomandibular joints appear intact. No displaced fractures are identified. Multiple dental caries. Periapical lucencies around bilateral molars consistent with periodontal disease. Degenerative changes in the temporomandibular joints.  CT CERVICAL SPINE FINDINGS  Degenerative changes throughout the cervical spine with narrowed cervical interspaces and endplate hypertrophic changes throughout. Degenerative changes throughout the cervical facet joints and at C1-2 level. Coalition of the C5-C6 vertebral bodies, likely congenital. There is slight anterior subluxation of C2 on C3 and of C7 on T1. This likely represents degenerative change although ligamentous injury is not excluded. No prevertebral soft tissue swelling. No vertebral compression deformities. Normal alignment of the facet joints. No focal bone lesion or bone destruction. Bone cortex and trabecular architecture appears intact. Vascular calcifications.  IMPRESSION: Bilateral acute subdural hematomas, greater on the right. No significant mass effect or midline shift. Chronic atrophy and small vessel ischemic changes.  Large soft tissue hematoma over the right side of the face. Air-fluid level in the right maxillary antrum. No displaced orbital or facial fractures identified.  Degenerative changes throughout the cervical spine. Slight anterior subluxation of C2 on C3 and C7 on T1, likely degenerative but ligamentous injury not excluded. No displaced fractures identified.  These results were called by telephone at the time of interpretation on 06/26/2014 at 5:57 pm to Dr. Ezequiel Essex , who verbally acknowledged these results.   Electronically Signed   By: Lucienne Capers M.D.   On: 06/26/2014 18:00     EKG Interpretation   Date/Time:  Monday June 26 2014 17:16:35 EST Ventricular Rate:  79 PR Interval:    QRS Duration: 109 QT Interval:  397 QTC Calculation: 455 R Axis:   60 Text Interpretation:   Atrial fibrillation RSR' in V1 or V2, right VCD or  RVH LVH with secondary repolarization abnormality new atrial fibrillation  Confirmed by Wyvonnia Dusky  MD, Bisma Klett [phone]) on 06/26/2014 5:24:59 PM      MDM   Final diagnoses:  Fall    Primary intact. Remainder of secondary survey as detailed above in PE section.   CT head, CT C spine and face. CXR, PXR. Injuries evaluated with plain films as needed. Significant findings include:  Significant events while pt was in ED include: Bilateral acute subdural hematomas, greater on the right. No significant mass effect or midline shift. Chronic atrophy and small vessel ischemic changes.  Large soft tissue hematoma over the right side of the face. Air-fluid level in the right maxillary antrum. No displaced orbital or facial fractures identified.  Degenerative changes throughout the cervical spine. Slight anterior subluxation of C2 on C3 and C7 on T1, likely degenerative but ligamentous injury not excluded. No displaced fractures identified.  Neurosurgery was consult would who has seen the patient and has discussed close care with family. Family does not believe patient would like surgical intervention. Neurosurgery recommends admission for observation and supportive care only, as well as further syncopal workup.  After thorough examination and workup this patient  was deemed to be appropriate for admission to hospitalist service. They remained stable in the ED until time of transfer.   Labs and imaging reviewed by myself and considered in medical decision making if ordered. Patient was discussed with attending physician Dr. Wyvonnia Dusky.  Pt seen in conjunction with Dr. Ezequiel Essex, MD  Kirstie Peri, [facility] Resident - PGY-2    Kirstie Peri, MD 06/26/14 2924  Ezequiel Essex, MD 06/27/14 [phone]

## 2014-06-26 NOTE — H&P (Signed)
[facility] History and Physical  Stephanie [name] RXV:[id number] DOB: Apr 15, 1923 DOA: 06/26/2014  Referring physician: ER physician. PCP: Mayra Neer, MD   Chief Complaint: Fall.  Most of the history is obtained from ER physician as patient is confused and I am unable to reach the family.  HPI: Stephanie [name] is a 81 y.o. female medical history of hypertension and hypothyroidism was brought to the ER after patient was found to have had a fall in the parking lot. Patient was found to have facial hematoma and was brought to the ER. CT head shows bilateral subdural hematoma along with large facial hematoma. On-call neurosurgeon Dr. Trenton Gammon was consulted and at this time Dr. Trenton Gammon is planning conservative management and also family has requested that patient may not want to have surgical intervention. Patient's EKG shows atrial fibrillation probably new onset. Patient does not provide much history but denies any chest pain or shortness of breath. Patient is able to move all extremities. As per the history patient has originally gone to the pharmacy to get medications and usually lives alone.  Review of Systems: As presented in the history of presenting illness, rest negative.  Past Medical History  Diagnosis Date  . Arthritis   . Cancer     rt breast  . GERD (gastroesophageal reflux disease)   . Hypertension   . Osteoporosis   . Thyroid disease     hypothyroidism   Past Surgical History  Procedure Laterality Date  . Mastectomy  1997    right breast  . Appendectomy  1942  . Abdominal hysterectomy  1970  . Anterior cervical discectomy  1975  . Thyroidectomy  1980  . Cholecystectomy  1988  . Shoulder surgery  1996  . Back surgery  2000    ray TFC device  . Total hip arthroplasty  2004  . Cataract extraction  2004    bilateral  . Elbow surgery  1955  . Total knee arthroplasty  2005  . Total hip arthroplasty  2007   Social History:  reports that she has never smoked. She  does not have any smokeless tobacco history on file. She reports that she does not drink alcohol or use illicit drugs. Where does patient live home. Can patient participate in ADLs? Not sure.  No Known Allergies  Family History:  Family History  Problem Relation Age of Onset  . Cancer Mother     uterine  . Heart disease Mother   . Cancer Father     brain  . Heart disease Father       Prior to Admission medications   Medication Sig Start Date End Date Taking? Authorizing Provider  hydrochlorothiazide 25 MG tablet Take 25 mg by mouth daily.     Yes Historical Provider, MD  latanoprost (XALATAN) 0.005 % ophthalmic solution Place 1 drop into both eyes at bedtime.     Yes Historical Provider, MD  levothyroxine (SYNTHROID, LEVOTHROID) 75 MCG tablet Take 75 mcg by mouth daily.     Yes Historical Provider, MD    Physical Exam: Filed Vitals:   06/26/14 1800 06/26/14 1830 06/26/14 1845 06/26/14 1915  BP: 156/97 167/95 183/87 173/90  Pulse: 78 98 93 87  Temp:      TempSrc:      Resp: 21 22 16 27   Height:      Weight:      SpO2: 96% 96% 100% 100%     General:  Moderately built and nourished.  Eyes: Right periorbital hematoma.  ENT: Large right facial hematoma.  Neck: Presently on neck brace.  Cardiovascular: S1-S2 heard.  Respiratory: No rhonchi or crepitations.  Abdomen: Soft nontender bowel sounds present.  Skin: Large facial hematoma.  Musculoskeletal: No edema in the lower extremities.  Psychiatric: Patient appears confused.  Neurologic: Patient is alert but confused. Moves all extremities.  Labs on Admission:  Basic Metabolic Panel:  Recent Labs Lab 06/26/14 1818  NA 142  K 3.5  CL 106  CO2 25  GLUCOSE 96  BUN 23  CREATININE 1.44*  CALCIUM 8.7   Liver Function Tests: No results for input(s): AST, ALT, ALKPHOS, BILITOT, PROT, ALBUMIN in the last 168 hours. No results for input(s): LIPASE, AMYLASE in the last 168 hours. No results for input(s):  AMMONIA in the last 168 hours. CBC:  Recent Labs Lab 06/26/14 1818  WBC 8.4  NEUTROABS 6.8  HGB 12.9  HCT 39.0  MCV 89.2  PLT 217   Cardiac Enzymes: No results for input(s): CKTOTAL, CKMB, CKMBINDEX, TROPONINI in the last 168 hours.  BNP (last 3 results) No results for input(s): BNP in the last 8760 hours.  ProBNP (last 3 results) No results for input(s): PROBNP in the last 8760 hours.  CBG: No results for input(s): GLUCAP in the last 168 hours.  Radiological Exams on Admission: Ct Head Wo Contrast  06/26/2014   CLINICAL DATA:  Unwitnessed fall. Hematoma to the right side of face. Confusion.  EXAM: CT HEAD WITHOUT CONTRAST  CT MAXILLOFACIAL WITHOUT CONTRAST  CT CERVICAL SPINE WITHOUT CONTRAST  TECHNIQUE: Multidetector CT imaging of the head, cervical spine, and maxillofacial structures were performed using the standard protocol without intravenous contrast. Multiplanar CT image reconstructions of the cervical spine and maxillofacial structures were also generated.  COMPARISON:  None.  FINDINGS: CT HEAD FINDINGS  Diffuse cerebral atrophy. Mild ventricular dilatation consistent with central atrophy. Low-attenuation changes in the deep white matter consistent with small vessel ischemia. There is an acute right frontoparietal subdural hematoma. Maximal depth measures about 10 mm. Focal acute hematoma also demonstrated in the left posterior temporal region measuring 1.3 x 2.3 cm. This appears to be extra-axial and probably also represents a subdural hematoma. No definite intraparenchymal or intraventricular hemorrhage. No mass effect or midline shift. Gray-white matter junctions are distinct. Basal cisterns are not effaced. Mastoid air cells are not opacified. Vascular calcifications. Calvarium appears intact. No displaced skull fractures.  CT MAXILLOFACIAL FINDINGS  Large soft tissue hematoma over the right side of the face extending from the infraorbital region down along the maxillary region  and over the anterior mandible. Air-fluid level in the right maxillary antrum probably represents hemorrhage. Small retention cyst in the sphenoid sinus. Paranasal sinuses are otherwise clear. Globes and extraocular muscles appear intact and symmetrical. The orbital rims, nasal bones, maxillary antral walls, zygomatic arches, pterygoid plates, mandibles, and temporomandibular joints appear intact. No displaced fractures are identified. Multiple dental caries. Periapical lucencies around bilateral molars consistent with periodontal disease. Degenerative changes in the temporomandibular joints.  CT CERVICAL SPINE FINDINGS  Degenerative changes throughout the cervical spine with narrowed cervical interspaces and endplate hypertrophic changes throughout. Degenerative changes throughout the cervical facet joints and at C1-2 level. Coalition of the C5-C6 vertebral bodies, likely congenital. There is slight anterior subluxation of C2 on C3 and of C7 on T1. This likely represents degenerative change although ligamentous injury is not excluded. No prevertebral soft tissue swelling. No vertebral compression deformities. Normal alignment of the facet joints. No focal bone lesion or bone destruction.  Bone cortex and trabecular architecture appears intact. Vascular calcifications.  IMPRESSION: Bilateral acute subdural hematomas, greater on the right. No significant mass effect or midline shift. Chronic atrophy and small vessel ischemic changes.  Large soft tissue hematoma over the right side of the face. Air-fluid level in the right maxillary antrum. No displaced orbital or facial fractures identified.  Degenerative changes throughout the cervical spine. Slight anterior subluxation of C2 on C3 and C7 on T1, likely degenerative but ligamentous injury not excluded. No displaced fractures identified.  These results were called by telephone at the time of interpretation on 06/26/2014 at 5:57 pm to Dr. Ezequiel Essex , who verbally  acknowledged these results.   Electronically Signed   By: Lucienne Capers M.D.   On: 06/26/2014 18:00   Ct Cervical Spine Wo Contrast  06/26/2014   CLINICAL DATA:  Unwitnessed fall. Hematoma to the right side of face. Confusion.  EXAM: CT HEAD WITHOUT CONTRAST  CT MAXILLOFACIAL WITHOUT CONTRAST  CT CERVICAL SPINE WITHOUT CONTRAST  TECHNIQUE: Multidetector CT imaging of the head, cervical spine, and maxillofacial structures were performed using the standard protocol without intravenous contrast. Multiplanar CT image reconstructions of the cervical spine and maxillofacial structures were also generated.  COMPARISON:  None.  FINDINGS: CT HEAD FINDINGS  Diffuse cerebral atrophy. Mild ventricular dilatation consistent with central atrophy. Low-attenuation changes in the deep white matter consistent with small vessel ischemia. There is an acute right frontoparietal subdural hematoma. Maximal depth measures about 10 mm. Focal acute hematoma also demonstrated in the left posterior temporal region measuring 1.3 x 2.3 cm. This appears to be extra-axial and probably also represents a subdural hematoma. No definite intraparenchymal or intraventricular hemorrhage. No mass effect or midline shift. Gray-white matter junctions are distinct. Basal cisterns are not effaced. Mastoid air cells are not opacified. Vascular calcifications. Calvarium appears intact. No displaced skull fractures.  CT MAXILLOFACIAL FINDINGS  Large soft tissue hematoma over the right side of the face extending from the infraorbital region down along the maxillary region and over the anterior mandible. Air-fluid level in the right maxillary antrum probably represents hemorrhage. Small retention cyst in the sphenoid sinus. Paranasal sinuses are otherwise clear. Globes and extraocular muscles appear intact and symmetrical. The orbital rims, nasal bones, maxillary antral walls, zygomatic arches, pterygoid plates, mandibles, and temporomandibular joints appear  intact. No displaced fractures are identified. Multiple dental caries. Periapical lucencies around bilateral molars consistent with periodontal disease. Degenerative changes in the temporomandibular joints.  CT CERVICAL SPINE FINDINGS  Degenerative changes throughout the cervical spine with narrowed cervical interspaces and endplate hypertrophic changes throughout. Degenerative changes throughout the cervical facet joints and at C1-2 level. Coalition of the C5-C6 vertebral bodies, likely congenital. There is slight anterior subluxation of C2 on C3 and of C7 on T1. This likely represents degenerative change although ligamentous injury is not excluded. No prevertebral soft tissue swelling. No vertebral compression deformities. Normal alignment of the facet joints. No focal bone lesion or bone destruction. Bone cortex and trabecular architecture appears intact. Vascular calcifications.  IMPRESSION: Bilateral acute subdural hematomas, greater on the right. No significant mass effect or midline shift. Chronic atrophy and small vessel ischemic changes.  Large soft tissue hematoma over the right side of the face. Air-fluid level in the right maxillary antrum. No displaced orbital or facial fractures identified.  Degenerative changes throughout the cervical spine. Slight anterior subluxation of C2 on C3 and C7 on T1, likely degenerative but ligamentous injury not excluded. No displaced fractures identified.  These  results were called by telephone at the time of interpretation on 06/26/2014 at 5:57 pm to Dr. Ezequiel Essex , who verbally acknowledged these results.   Electronically Signed   By: Lucienne Capers M.D.   On: 06/26/2014 18:00   Dg Pelvis Portable  06/26/2014   CLINICAL DATA:  Trauma patient. Fall tonight. No pelvic complaints. Mostly facial trauma.  EXAM: PORTABLE PELVIS 1-2 VIEWS  COMPARISON:  CT abdomen and pelvis 04/07/2006  FINDINGS: Bilateral total hip arthroplasties are incompletely included within the  field of view. The entire femoral prosthesis are not evaluated. Visualized portions appear intact. Pelvis appears intact without evidence of acute fracture or dislocation. Postoperative changes at the lumbosacral interspace. Vascular calcifications.  IMPRESSION: Postoperative changes. No evidence of acute fracture or dislocation of the pelvis.   Electronically Signed   By: Lucienne Capers M.D.   On: 06/26/2014 17:30   Dg Chest Portable 1 View  06/26/2014   CLINICAL DATA:  Patient fell.  Confusion  EXAM: PORTABLE CHEST - 1 VIEW  COMPARISON:  May 28, 2011  FINDINGS: There is left lower lobe consolidation with small left effusion. There is underlying emphysema. Elsewhere lungs are clear. Heart is mildly enlarged with pulmonary vascularity within normal limits. No pneumothorax. No fractures are appreciable. Patient is status post right mastectomy with surgical clips in right axillary region. 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  IMPRESSION: Left lower lobe consolidation with small left effusion. Underlying emphysema. No fractures are apparent. No pneumothorax.   Electronically Signed   By: Lowella Grip M.D.   On: 06/26/2014 17:29   Dg Knee Complete 4 Views Right  06/26/2014   CLINICAL DATA:  Fall today 06/26/2014.  RIGHT knee pain.  EXAM: RIGHT KNEE - COMPLETE 4+ VIEW  COMPARISON:  None.  FINDINGS: The patient was unable to be adequately positioned for standard views. Nonstandard views degrade the study. Allowing for degradation of the study, there is no fracture or acute osseous abnormality. Medial and patellofemoral compartment predominant osteoarthritis is present. Chondrocalcinosis of the menisci. Atherosclerosis. The alignment of the knee appears anatomic. No effusion.  IMPRESSION: No acute osseous abnormality. Medial and patellofemoral compartment osteoarthritis.   Electronically Signed   By: Dereck Ligas M.D.   On: 06/26/2014 19:24   Ct Maxillofacial Wo Cm  06/26/2014    CLINICAL DATA:  Unwitnessed fall. Hematoma to the right side of face. Confusion.  EXAM: CT HEAD WITHOUT CONTRAST  CT MAXILLOFACIAL WITHOUT CONTRAST  CT CERVICAL SPINE WITHOUT CONTRAST  TECHNIQUE: Multidetector CT imaging of the head, cervical spine, and maxillofacial structures were performed using the standard protocol without intravenous contrast. Multiplanar CT image reconstructions of the cervical spine and maxillofacial structures were also generated.  COMPARISON:  None.  FINDINGS: CT HEAD FINDINGS  Diffuse cerebral atrophy. Mild ventricular dilatation consistent with central atrophy. Low-attenuation changes in the deep white matter consistent with small vessel ischemia. There is an acute right frontoparietal subdural hematoma. Maximal depth measures about 10 mm. Focal acute hematoma also demonstrated in the left posterior temporal region measuring 1.3 x 2.3 cm. This appears to be extra-axial and probably also represents a subdural hematoma. No definite intraparenchymal or intraventricular hemorrhage. No mass effect or midline shift. Gray-white matter junctions are distinct. Basal cisterns are not effaced. Mastoid air cells are not opacified. Vascular calcifications. Calvarium appears intact. No displaced skull fractures.  CT MAXILLOFACIAL FINDINGS  Large soft tissue hematoma over the right side of the face extending from the infraorbital region down along the  maxillary region and over the anterior mandible. Air-fluid level in the right maxillary antrum probably represents hemorrhage. Small retention cyst in the sphenoid sinus. Paranasal sinuses are otherwise clear. Globes and extraocular muscles appear intact and symmetrical. The orbital rims, nasal bones, maxillary antral walls, zygomatic arches, pterygoid plates, mandibles, and temporomandibular joints appear intact. No displaced fractures are identified. Multiple dental caries. Periapical lucencies around bilateral molars consistent with periodontal  disease. Degenerative changes in the temporomandibular joints.  CT CERVICAL SPINE FINDINGS  Degenerative changes throughout the cervical spine with narrowed cervical interspaces and endplate hypertrophic changes throughout. Degenerative changes throughout the cervical facet joints and at C1-2 level. Coalition of the C5-C6 vertebral bodies, likely congenital. There is slight anterior subluxation of C2 on C3 and of C7 on T1. This likely represents degenerative change although ligamentous injury is not excluded. No prevertebral soft tissue swelling. No vertebral compression deformities. Normal alignment of the facet joints. No focal bone lesion or bone destruction. Bone cortex and trabecular architecture appears intact. Vascular calcifications.  IMPRESSION: Bilateral acute subdural hematomas, greater on the right. No significant mass effect or midline shift. Chronic atrophy and small vessel ischemic changes.  Large soft tissue hematoma over the right side of the face. Air-fluid level in the right maxillary antrum. No displaced orbital or facial fractures identified.  Degenerative changes throughout the cervical spine. Slight anterior subluxation of C2 on C3 and C7 on T1, likely degenerative but ligamentous injury not excluded. No displaced fractures identified.  These results were called by telephone at the time of interpretation on 06/26/2014 at 5:57 pm to Dr. Ezequiel Essex , who verbally acknowledged these results.   Electronically Signed   By: Lucienne Capers M.D.   On: 06/26/2014 18:00    EKG: Independently reviewed. Atrial fibrillation with RSR pattern and LVH.  Assessment/Plan Active Problems:   SDH (subdural hematoma)   Facial hematoma   Atrial fibrillation, new onset   Hypothyroidism   Hypertension   Subdural hematoma   1. Bilateral subdural hematoma status post fall - appreciate neurosurgical consult. At this time patient will be managed conservatively as per the neurosurgery. Closely observe.  Get physical therapy consult. 2. Atrial fibrillation rate controlled - probably new onset. Patient is not a candidate for anticoagulation secondary to #1. Continue to observe in telemetry. 3. Renal failure probably chronic kidney disease stage III - gently hydrate for now and hold hydrochlorothiazide. 4. Fall probably from syncope - closely monitor in telemetry as patient has atrial fibrillation monitor for any arrhythmias. Get physical therapy consult. 5. Large facial hematoma from fall - maxillofacial CT does not show any fractures. Closely observe. 6. Lung consolidation in the chest x-ray - patient has been placed on empiric antibiotics and closely observe. 7. Hypothyroidism - continue Synthroid. 8. Hypertension - presently hold off hydrochlorothiazide and gently hydrate him closely observe blood pressure trends.   DVT Prophylaxis SCDs.  Code Status: Full code for now. Need to address after able to reach family.  Family Communication: Unable to reach family.  Disposition Plan: Admit to inpatient.    Carvell Hoeffner N. [facility] Pager [phone].  If 7PM-7AM, please contact night-coverage [email or web address] Password [facility] 06/26/2014, 9:59 PM

## 2014-06-26 NOTE — ED Notes (Signed)
MD at bedside. 

## 2014-06-26 NOTE — ED Notes (Signed)
Pt next of kin larry burnsid given pts keys, pocketbook, wallet, and jacket. #[phone]

## 2014-06-26 NOTE — ED Notes (Signed)
Patient hard of hearing

## 2014-06-26 NOTE — ED Notes (Signed)
See trauma narrator 

## 2014-06-26 NOTE — Consult Note (Signed)
Reason for Consult: Subdural hematoma Referring Physician: Emergency department  Stephanie [name] is an 81 y.o. female.  HPI: The patient is an 81 year old female who suffered a fall most likely secondary to a syncopal episode. Patient was found on the ground confused and somewhat disoriented. She has obvious injury to her right face with marked facial swelling and multiple surface abrasions. She has been conscious and he mechanically stable throughout her emergency department evaluation. She has no other evidence of injury. She denies current chest pain.  Past Medical History  Diagnosis Date  . Arthritis   . Cancer     rt breast  . GERD (gastroesophageal reflux disease)   . Hypertension   . Osteoporosis   . Thyroid disease     hypothyroidism    Past Surgical History  Procedure Laterality Date  . Mastectomy  1997    right breast  . Appendectomy  1942  . Abdominal hysterectomy  1970  . Anterior cervical discectomy  1975  . Thyroidectomy  1980  . Cholecystectomy  1988  . Shoulder surgery  1996  . Back surgery  2000    ray TFC device  . Total hip arthroplasty  2004  . Cataract extraction  2004    bilateral  . Elbow surgery  1955  . Total knee arthroplasty  2005  . Total hip arthroplasty  2007    Family History  Problem Relation Age of Onset  . Cancer Mother     uterine  . Heart disease Mother   . Cancer Father     brain  . Heart disease Father     Social History:  reports that she has never smoked. She does not have any smokeless tobacco history on file. She reports that she does not drink alcohol or use illicit drugs.  Allergies: No Known Allergies  Medications: I have reviewed the patient's current medications.  Results for orders placed or performed during the hospital encounter of 06/26/14 (from the past 48 hour(s))  Protime-INR     Status: None   Collection Time: 06/26/14  5:20 PM  Result Value Ref Range   Prothrombin Time 15.2 11.6 - 15.2 seconds   INR  1.18 0.00 - 1.49  I-stat troponin, ED     Status: None   Collection Time: 06/26/14  5:29 PM  Result Value Ref Range   Troponin i, poc 0.02 0.00 - 0.08 ng/mL   Comment 3            Comment: Due to the release kinetics of cTnI, a negative result within the first hours of the onset of symptoms does not rule out myocardial infarction with certainty. If myocardial infarction is still suspected, repeat the test at appropriate intervals.     Ct Head Wo Contrast  06/26/2014   CLINICAL DATA:  Unwitnessed fall. Hematoma to the right side of face. Confusion.  EXAM: CT HEAD WITHOUT CONTRAST  CT MAXILLOFACIAL WITHOUT CONTRAST  CT CERVICAL SPINE WITHOUT CONTRAST  TECHNIQUE: Multidetector CT imaging of the head, cervical spine, and maxillofacial structures were performed using the standard protocol without intravenous contrast. Multiplanar CT image reconstructions of the cervical spine and maxillofacial structures were also generated.  COMPARISON:  None.  FINDINGS: CT HEAD FINDINGS  Diffuse cerebral atrophy. Mild ventricular dilatation consistent with central atrophy. Low-attenuation changes in the deep white matter consistent with small vessel ischemia. There is an acute right frontoparietal subdural hematoma. Maximal depth measures about 10 mm. Focal acute hematoma also demonstrated in the left  posterior temporal region measuring 1.3 x 2.3 cm. This appears to be extra-axial and probably also represents a subdural hematoma. No definite intraparenchymal or intraventricular hemorrhage. No mass effect or midline shift. Gray-white matter junctions are distinct. Basal cisterns are not effaced. Mastoid air cells are not opacified. Vascular calcifications. Calvarium appears intact. No displaced skull fractures.  CT MAXILLOFACIAL FINDINGS  Large soft tissue hematoma over the right side of the face extending from the infraorbital region down along the maxillary region and over the anterior mandible. Air-fluid level in the  right maxillary antrum probably represents hemorrhage. Small retention cyst in the sphenoid sinus. Paranasal sinuses are otherwise clear. Globes and extraocular muscles appear intact and symmetrical. The orbital rims, nasal bones, maxillary antral walls, zygomatic arches, pterygoid plates, mandibles, and temporomandibular joints appear intact. No displaced fractures are identified. Multiple dental caries. Periapical lucencies around bilateral molars consistent with periodontal disease. Degenerative changes in the temporomandibular joints.  CT CERVICAL SPINE FINDINGS  Degenerative changes throughout the cervical spine with narrowed cervical interspaces and endplate hypertrophic changes throughout. Degenerative changes throughout the cervical facet joints and at C1-2 level. Coalition of the C5-C6 vertebral bodies, likely congenital. There is slight anterior subluxation of C2 on C3 and of C7 on T1. This likely represents degenerative change although ligamentous injury is not excluded. No prevertebral soft tissue swelling. No vertebral compression deformities. Normal alignment of the facet joints. No focal bone lesion or bone destruction. Bone cortex and trabecular architecture appears intact. Vascular calcifications.  IMPRESSION: Bilateral acute subdural hematomas, greater on the right. No significant mass effect or midline shift. Chronic atrophy and small vessel ischemic changes.  Large soft tissue hematoma over the right side of the face. Air-fluid level in the right maxillary antrum. No displaced orbital or facial fractures identified.  Degenerative changes throughout the cervical spine. Slight anterior subluxation of C2 on C3 and C7 on T1, likely degenerative but ligamentous injury not excluded. No displaced fractures identified.  These results were called by telephone at the time of interpretation on 06/26/2014 at 5:57 pm to Dr. Ezequiel Essex , who verbally acknowledged these results.   Electronically Signed   By:  Lucienne Capers M.D.   On: 06/26/2014 18:00   Ct Cervical Spine Wo Contrast  06/26/2014   CLINICAL DATA:  Unwitnessed fall. Hematoma to the right side of face. Confusion.  EXAM: CT HEAD WITHOUT CONTRAST  CT MAXILLOFACIAL WITHOUT CONTRAST  CT CERVICAL SPINE WITHOUT CONTRAST  TECHNIQUE: Multidetector CT imaging of the head, cervical spine, and maxillofacial structures were performed using the standard protocol without intravenous contrast. Multiplanar CT image reconstructions of the cervical spine and maxillofacial structures were also generated.  COMPARISON:  None.  FINDINGS: CT HEAD FINDINGS  Diffuse cerebral atrophy. Mild ventricular dilatation consistent with central atrophy. Low-attenuation changes in the deep white matter consistent with small vessel ischemia. There is an acute right frontoparietal subdural hematoma. Maximal depth measures about 10 mm. Focal acute hematoma also demonstrated in the left posterior temporal region measuring 1.3 x 2.3 cm. This appears to be extra-axial and probably also represents a subdural hematoma. No definite intraparenchymal or intraventricular hemorrhage. No mass effect or midline shift. Gray-white matter junctions are distinct. Basal cisterns are not effaced. Mastoid air cells are not opacified. Vascular calcifications. Calvarium appears intact. No displaced skull fractures.  CT MAXILLOFACIAL FINDINGS  Large soft tissue hematoma over the right side of the face extending from the infraorbital region down along the maxillary region and over the anterior mandible. Air-fluid level  in the right maxillary antrum probably represents hemorrhage. Small retention cyst in the sphenoid sinus. Paranasal sinuses are otherwise clear. Globes and extraocular muscles appear intact and symmetrical. The orbital rims, nasal bones, maxillary antral walls, zygomatic arches, pterygoid plates, mandibles, and temporomandibular joints appear intact. No displaced fractures are identified. Multiple  dental caries. Periapical lucencies around bilateral molars consistent with periodontal disease. Degenerative changes in the temporomandibular joints.  CT CERVICAL SPINE FINDINGS  Degenerative changes throughout the cervical spine with narrowed cervical interspaces and endplate hypertrophic changes throughout. Degenerative changes throughout the cervical facet joints and at C1-2 level. Coalition of the C5-C6 vertebral bodies, likely congenital. There is slight anterior subluxation of C2 on C3 and of C7 on T1. This likely represents degenerative change although ligamentous injury is not excluded. No prevertebral soft tissue swelling. No vertebral compression deformities. Normal alignment of the facet joints. No focal bone lesion or bone destruction. Bone cortex and trabecular architecture appears intact. Vascular calcifications.  IMPRESSION: Bilateral acute subdural hematomas, greater on the right. No significant mass effect or midline shift. Chronic atrophy and small vessel ischemic changes.  Large soft tissue hematoma over the right side of the face. Air-fluid level in the right maxillary antrum. No displaced orbital or facial fractures identified.  Degenerative changes throughout the cervical spine. Slight anterior subluxation of C2 on C3 and C7 on T1, likely degenerative but ligamentous injury not excluded. No displaced fractures identified.  These results were called by telephone at the time of interpretation on 06/26/2014 at 5:57 pm to Dr. Ezequiel Essex , who verbally acknowledged these results.   Electronically Signed   By: Lucienne Capers M.D.   On: 06/26/2014 18:00   Dg Pelvis Portable  06/26/2014   CLINICAL DATA:  Trauma patient. Fall tonight. No pelvic complaints. Mostly facial trauma.  EXAM: PORTABLE PELVIS 1-2 VIEWS  COMPARISON:  CT abdomen and pelvis 04/07/2006  FINDINGS: Bilateral total hip arthroplasties are incompletely included within the field of view. The entire femoral prosthesis are not  evaluated. Visualized portions appear intact. Pelvis appears intact without evidence of acute fracture or dislocation. Postoperative changes at the lumbosacral interspace. Vascular calcifications.  IMPRESSION: Postoperative changes. No evidence of acute fracture or dislocation of the pelvis.   Electronically Signed   By: Lucienne Capers M.D.   On: 06/26/2014 17:30   Dg Chest Portable 1 View  06/26/2014   CLINICAL DATA:  Patient fell.  Confusion  EXAM: PORTABLE CHEST - 1 VIEW  COMPARISON:  May 28, 2011  FINDINGS: There is left lower lobe consolidation with small left effusion. There is underlying emphysema. Elsewhere lungs are clear. Heart is mildly enlarged with pulmonary vascularity within normal limits. No pneumothorax. No fractures are appreciable. Patient is status post right mastectomy with surgical clips in right axillary region. 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  IMPRESSION: Left lower lobe consolidation with small left effusion. Underlying emphysema. No fractures are apparent. No pneumothorax.   Electronically Signed   By: Lowella Grip M.D.   On: 06/26/2014 17:29   Ct Maxillofacial Wo Cm  06/26/2014   CLINICAL DATA:  Unwitnessed fall. Hematoma to the right side of face. Confusion.  EXAM: CT HEAD WITHOUT CONTRAST  CT MAXILLOFACIAL WITHOUT CONTRAST  CT CERVICAL SPINE WITHOUT CONTRAST  TECHNIQUE: Multidetector CT imaging of the head, cervical spine, and maxillofacial structures were performed using the standard protocol without intravenous contrast. Multiplanar CT image reconstructions of the cervical spine and maxillofacial structures were also generated.  COMPARISON:  None.  FINDINGS: CT HEAD FINDINGS  Diffuse cerebral atrophy. Mild ventricular dilatation consistent with central atrophy. Low-attenuation changes in the deep white matter consistent with small vessel ischemia. There is an acute right frontoparietal subdural hematoma. Maximal depth measures about 10 mm. Focal  acute hematoma also demonstrated in the left posterior temporal region measuring 1.3 x 2.3 cm. This appears to be extra-axial and probably also represents a subdural hematoma. No definite intraparenchymal or intraventricular hemorrhage. No mass effect or midline shift. Gray-white matter junctions are distinct. Basal cisterns are not effaced. Mastoid air cells are not opacified. Vascular calcifications. Calvarium appears intact. No displaced skull fractures.  CT MAXILLOFACIAL FINDINGS  Large soft tissue hematoma over the right side of the face extending from the infraorbital region down along the maxillary region and over the anterior mandible. Air-fluid level in the right maxillary antrum probably represents hemorrhage. Small retention cyst in the sphenoid sinus. Paranasal sinuses are otherwise clear. Globes and extraocular muscles appear intact and symmetrical. The orbital rims, nasal bones, maxillary antral walls, zygomatic arches, pterygoid plates, mandibles, and temporomandibular joints appear intact. No displaced fractures are identified. Multiple dental caries. Periapical lucencies around bilateral molars consistent with periodontal disease. Degenerative changes in the temporomandibular joints.  CT CERVICAL SPINE FINDINGS  Degenerative changes throughout the cervical spine with narrowed cervical interspaces and endplate hypertrophic changes throughout. Degenerative changes throughout the cervical facet joints and at C1-2 level. Coalition of the C5-C6 vertebral bodies, likely congenital. There is slight anterior subluxation of C2 on C3 and of C7 on T1. This likely represents degenerative change although ligamentous injury is not excluded. No prevertebral soft tissue swelling. No vertebral compression deformities. Normal alignment of the facet joints. No focal bone lesion or bone destruction. Bone cortex and trabecular architecture appears intact. Vascular calcifications.  IMPRESSION: Bilateral acute subdural  hematomas, greater on the right. No significant mass effect or midline shift. Chronic atrophy and small vessel ischemic changes.  Large soft tissue hematoma over the right side of the face. Air-fluid level in the right maxillary antrum. No displaced orbital or facial fractures identified.  Degenerative changes throughout the cervical spine. Slight anterior subluxation of C2 on C3 and C7 on T1, likely degenerative but ligamentous injury not excluded. No displaced fractures identified.  These results were called by telephone at the time of interpretation on 06/26/2014 at 5:57 pm to Dr. Ezequiel Essex , who verbally acknowledged these results.   Electronically Signed   By: Lucienne Capers M.D.   On: 06/26/2014 18:00    Review of systems not obtained due to patient factors. Blood pressure 156/97, pulse 78, temperature 98.6 F (37 C), temperature source Oral, resp. rate 21, height 5\' 2"  (1.575 m), weight 45.36 kg (100 lb), SpO2 96 %. The patient is awake and aware. She is confused but still oriented to name and year. Her speech is fluent. She intermittently answers questions appropriately. Judgment and i insight are abnormal. Examination head ears eyes and throat demonstrates marked right maxillary swelling. She has multiple areas of facial abrasion without marked laceration or bony abnormality. Examination her neck demonstrates no obvious cervical tenderness. Airway is midline. Respirations are free from stridor. Examination her chest and abdomen are unremarkable. Examination of her extremities are free from significant injury or deformity. Neurologically her cranial nerve function is intact bilaterally. She moves all 4 extremities to command. Strength is equal and 5/5 bilaterally without drift.  Assessment/Plan: The patient has a significant right-sided convexity subdural hematoma as well as a small left temporal subdural  hematoma with a left posterior temporal contusion. Currently there is no evidence of  significant mass effect.  Patient is in frail overall health. According to family she has suffered multiple falls. This fall is most likely syncopal in origin given her lack of any defensive injury. I discussed situation with the family. They do not believe the patient would like any surgical intervention done should her condition worsen. Given her age and health I think this is appropriate. I would recommend admission for observation and supportive care only. I think she should be monitored for evaluation of cardiac arrhythmia or other causes syncope. I will be available should her situation worsened however given their preference toward no surgical intervention I do not see a role for neurosurgical admission.  Stephanie [name] A 06/26/2014, 6:26 PM

## 2014-06-27 DIAGNOSIS — R55 Syncope and collapse: Secondary | ICD-10-CM

## 2014-06-27 DIAGNOSIS — W19XXXA Unspecified fall, initial encounter: Secondary | ICD-10-CM | POA: Diagnosis present

## 2014-06-27 DIAGNOSIS — E876 Hypokalemia: Secondary | ICD-10-CM

## 2014-06-27 DIAGNOSIS — E038 Other specified hypothyroidism: Secondary | ICD-10-CM

## 2014-06-27 LAB — CBC WITH DIFFERENTIAL/PLATELET
BASOS ABS: 0 10*3/uL (ref 0.0–0.1)
Basophils Relative: 0 % (ref 0–1)
EOS ABS: 0 10*3/uL (ref 0.0–0.7)
EOS PCT: 0 % (ref 0–5)
HCT: 34.9 % — ABNORMAL LOW (ref 36.0–46.0)
Hemoglobin: 11.6 g/dL — ABNORMAL LOW (ref 12.0–15.0)
LYMPHS ABS: 0.6 10*3/uL — AB (ref 0.7–4.0)
Lymphocytes Relative: 6 % — ABNORMAL LOW (ref 12–46)
MCH: 29.8 pg (ref 26.0–34.0)
MCHC: 33.2 g/dL (ref 30.0–36.0)
MCV: 89.7 fL (ref 78.0–100.0)
Monocytes Absolute: 0.5 10*3/uL (ref 0.1–1.0)
Monocytes Relative: 4 % (ref 3–12)
Neutro Abs: 9.8 10*3/uL — ABNORMAL HIGH (ref 1.7–7.7)
Neutrophils Relative %: 90 % — ABNORMAL HIGH (ref 43–77)
Platelets: 208 10*3/uL (ref 150–400)
RBC: 3.89 MIL/uL (ref 3.87–5.11)
RDW: 14.1 % (ref 11.5–15.5)
WBC: 10.9 10*3/uL — AB (ref 4.0–10.5)

## 2014-06-27 LAB — COMPREHENSIVE METABOLIC PANEL
ALT: 15 U/L (ref 0–35)
ANION GAP: 7 (ref 5–15)
AST: 25 U/L (ref 0–37)
Albumin: 3 g/dL — ABNORMAL LOW (ref 3.5–5.2)
Alkaline Phosphatase: 65 U/L (ref 39–117)
BILIRUBIN TOTAL: 0.9 mg/dL (ref 0.3–1.2)
BUN: 19 mg/dL (ref 6–23)
CO2: 29 mmol/L (ref 19–32)
Calcium: 8.3 mg/dL — ABNORMAL LOW (ref 8.4–10.5)
Chloride: 105 mmol/L (ref 96–112)
Creatinine, Ser: 1.36 mg/dL — ABNORMAL HIGH (ref 0.50–1.10)
GFR calc Af Amer: 39 mL/min — ABNORMAL LOW (ref >90)
GFR calc non Af Amer: 33 mL/min — ABNORMAL LOW (ref >90)
Glucose, Bld: 139 mg/dL — ABNORMAL HIGH (ref 70–99)
Potassium: 3 mmol/L — ABNORMAL LOW (ref 3.5–5.1)
SODIUM: 141 mmol/L (ref 135–145)
Total Protein: 5 g/dL — ABNORMAL LOW (ref 6.0–8.3)

## 2014-06-27 LAB — MAGNESIUM: MAGNESIUM: 1.4 mg/dL — AB (ref 1.5–2.5)

## 2014-06-27 LAB — TROPONIN I
TROPONIN I: 0.04 ng/mL — AB (ref <0.031)
Troponin I: 0.04 ng/mL — ABNORMAL HIGH (ref <0.031)
Troponin I: 0.03 ng/mL (ref <0.031)

## 2014-06-27 LAB — TSH: TSH: 9.448 u[IU]/mL — ABNORMAL HIGH (ref 0.350–4.500)

## 2014-06-27 LAB — POTASSIUM: Potassium: 3.4 mmol/L — ABNORMAL LOW (ref 3.5–5.1)

## 2014-06-27 LAB — MRSA PCR SCREENING: MRSA BY PCR: NEGATIVE

## 2014-06-27 MED ORDER — POTASSIUM CHLORIDE 10 MEQ/100ML IV SOLN
10.0000 meq | INTRAVENOUS | Status: AC
  Administered 2014-06-27 (×4): 10 meq via INTRAVENOUS
  Filled 2014-06-27: qty 100

## 2014-06-27 MED ORDER — ONDANSETRON HCL 4 MG/2ML IJ SOLN
4.0000 mg | Freq: Four times a day (QID) | INTRAMUSCULAR | Status: DC | PRN
  Administered 2014-06-27: 4 mg via INTRAVENOUS
  Filled 2014-06-27: qty 2

## 2014-06-27 MED ORDER — ACETAMINOPHEN 325 MG PO TABS
650.0000 mg | ORAL_TABLET | Freq: Four times a day (QID) | ORAL | Status: DC | PRN
  Administered 2014-06-29: 650 mg via ORAL
  Filled 2014-06-27: qty 2

## 2014-06-27 MED ORDER — ACETAMINOPHEN 650 MG RE SUPP: 650.0000 mg | Freq: Four times a day (QID) | RECTAL | Status: DC | PRN

## 2014-06-27 MED ORDER — POTASSIUM CHLORIDE 10 MEQ/100ML IV SOLN
10.0000 meq | INTRAVENOUS | Status: AC
  Administered 2014-06-27 (×4): 10 meq via INTRAVENOUS
  Filled 2014-06-27 (×4): qty 100

## 2014-06-27 MED ORDER — HYDRALAZINE HCL 20 MG/ML IJ SOLN: 5.0000 mg | Freq: Four times a day (QID) | INTRAMUSCULAR | Status: DC | PRN

## 2014-06-27 MED ORDER — LEVOTHYROXINE SODIUM 75 MCG PO TABS
75.0000 ug | ORAL_TABLET | Freq: Every day | ORAL | Status: DC
  Filled 2014-06-27 (×2): qty 1

## 2014-06-27 MED ORDER — DEXTROSE 5 % IV SOLN
500.0000 mg | Freq: Every day | INTRAVENOUS | Status: DC
  Administered 2014-06-27 – 2014-06-28 (×3): 500 mg via INTRAVENOUS
  Filled 2014-06-27 (×4): qty 500

## 2014-06-27 MED ORDER — DEXTROSE 5 % IV SOLN
1.0000 g | Freq: Every day | INTRAVENOUS | Status: DC
  Administered 2014-06-27 – 2014-06-28 (×3): 1 g via INTRAVENOUS
  Filled 2014-06-27 (×4): qty 10

## 2014-06-27 MED ORDER — SODIUM CHLORIDE 0.9 % IV SOLN
INTRAVENOUS | Status: DC
  Administered 2014-06-27 (×2): via INTRAVENOUS

## 2014-06-27 MED ORDER — MAGNESIUM SULFATE 50 % IJ SOLN
3.0000 g | Freq: Once | INTRAVENOUS | Status: AC
  Administered 2014-06-27: 3 g via INTRAVENOUS
  Filled 2014-06-27: qty 6

## 2014-06-27 MED ORDER — ONDANSETRON HCL 4 MG PO TABS: 4.0000 mg | ORAL_TABLET | Freq: Four times a day (QID) | ORAL | Status: DC | PRN

## 2014-06-27 MED ORDER — LATANOPROST 0.005 % OP SOLN
1.0000 [drp] | Freq: Every day | OPHTHALMIC | Status: DC
  Administered 2014-06-27 – 2014-07-02 (×6): 1 [drp] via OPHTHALMIC
  Filled 2014-06-27 (×2): qty 2.5

## 2014-06-27 MED ORDER — LEVOTHYROXINE SODIUM 100 MCG PO TABS
100.0000 ug | ORAL_TABLET | Freq: Every day | ORAL | Status: DC
  Filled 2014-06-27 (×2): qty 1

## 2014-06-27 NOTE — Progress Notes (Signed)
[facility] TEAM 1 - Stepdown/ICU TEAM Progress Note  Stephanie [name] KZS:[id number] DOB: 1922-08-27 DOA: 06/26/2014 PCP: Mayra Neer, MD  Admit HPI / Brief Narrative: Stephanie [name] is a 81 y.o. female PMHx hypertension,hypothyroidism, breast cancer right S/P right mastectomy was brought to the ER after patient was found to have had a fall in the parking lot. Patient was found to have facial hematoma and was brought to the ER. CT head shows bilateral subdural hematoma along with large facial hematoma. On-call neurosurgeon Dr. Trenton Gammon was consulted and at this time Dr. Trenton Gammon is planning conservative management and also family has requested that patient may not want to have surgical intervention. Patient's EKG shows atrial fibrillation probably new onset. Patient does not provide much history but denies any chest pain or shortness of breath. Patient is able to move all extremities. As per the history patient has originally gone to the pharmacy to get medications and usually lives alone.  HPI/Subjective: 2/9 A/O 0, with draws to pain, when patient does speak word salad, occasionally will follow commands  Assessment/Plan: Bilateral subdural hematoma  -status post fall  -manage conservatively as per the neurosurgery. Closely observe. Get PT/OT consult when patient able to participate .  Acute encephalopathy -Secondary to SDH -responds to painful stimuli only  Atrial fibrillation rate controlled - Rate controlled -not a candidate for anticoagulation secondary to SDH  Renal failure probably chronic kidney disease stage III  - gently hydrate normal saline 50 ml/hr -Hold diureticst.  Large facial hematoma from fall  - maxillofacial CT does not show any fractures.  -Somewhat resolving patient able to open right eye  Lung consolidation in the chest x-ray/ CAP?  Hypothyroidism -TSH= 9.4 -Obtain free T4 -Increase Synthroid 100 g daily  HTN -Allow permissive hypertension secondary to SDH    -Hydralazine PRN SBP> 180 or DBP> 105           Hypokalemia -Potassium IV 10 mEq 4 runs -Recheck potassium at 1600 -Repeat potassium IV 10 mEq 4 runs  Hypomagnesemia -Magnesium IV 3 gm   Code Status: FULL Family Communication: Spoke with son in law Shylynn Bruning  family present at time of exam Disposition Plan:??    Consultants: Dr.Henry A Pool (neurosurgery)  Procedure/Significant Events: 2/8 PCXR;Left lower lobe consolidation with small left effusion. Underlying emphysema.. 2/8 CT C-spine and CT head without contrast;Bilateral acute subdural hematomas, Rt > Lt . No significant mass effect or midline shift. -Large soft tissue hematoma over the right side of the face. -Slight anterior subluxation of C2 on C3 and C7 on T1, likely degenerative but ligamentous injury not excluded.  2/9 echocardiogram;- Left ventricle: Hypokinesis base inferior wall. LVEF=50%. -Right/Left atrium:  mildly dilated. - Tricuspid valve: mild-moderate regurgitation. - Pulmonary arteries: PA peak pressure: 35 mm Hg (S).    Culture 2/9 MRSA by PCR negative  Antibiotics: Azithromycin 2/9>> Ceftriaxone 2/9>>   DVT prophylaxis: SCD   Devices    LINES / TUBES:      Continuous Infusions: . sodium chloride 50 mL/hr at 06/27/14 0700    Objective: VITAL SIGNS: Temp: 98.8 F (37.1 C) (02/09 0729) Temp Source: Axillary (02/09 0729) BP: 156/78 mmHg (02/09 1200) Pulse Rate: 69 (02/09 1200) SPO2; FIO2:   Intake/Output Summary (Last 24 hours) at 06/27/14 1224 Last data filed at 06/27/14 1100  Gross per 24 hour  Intake    550 ml  Output   2220 ml  Net  -1670 ml     Exam: General: Opens Eye to stimulation,  Follows some cmds (squeezes fingers), Word Salad. Answers yes to all questions.  Face Bilat Hematoma Rt> Lt, No acute respiratory distress Lungs: Clear to auscultation bilaterally without wheezes or crackles Cardiovascular: Iregular Iregular rate and rhythm without murmur  gallop or rub normal S1 and S2 Abdomen: Nontender, nondistended, soft, bowel sounds positive, no rebound, no ascites, no appreciable mass Extremities: No significant cyanosis, clubbing, or edema bilateral lower extremities Neorologic: CNII- XII intact. Responds Painful stimuli, Word Salad, Follows Some Cmds. PERRLA.  Data Reviewed: Basic Metabolic Panel:  Recent Labs Lab 06/26/14 1818 06/27/14 0336  NA 142 141  K 3.5 3.0*  CL 106 105  CO2 25 29  GLUCOSE 96 139*  BUN 23 19  CREATININE 1.44* 1.36*  CALCIUM 8.7 8.3*   Liver Function Tests:  Recent Labs Lab 06/27/14 0336  AST 25  ALT 15  ALKPHOS 65  BILITOT 0.9  PROT 5.0*  ALBUMIN 3.0*   No results for input(s): LIPASE, AMYLASE in the last 168 hours. No results for input(s): AMMONIA in the last 168 hours. CBC:  Recent Labs Lab 06/26/14 1818 06/27/14 0336  WBC 8.4 10.9*  NEUTROABS 6.8 9.8*  HGB 12.9 11.6*  HCT 39.0 34.9*  MCV 89.2 89.7  PLT 217 208   Cardiac Enzymes:  Recent Labs Lab 06/27/14 0336 06/27/14 1006  TROPONINI 0.04* 0.04*   BNP (last 3 results) No results for input(s): BNP in the last 8760 hours.  ProBNP (last 3 results) No results for input(s): PROBNP in the last 8760 hours.  CBG: No results for input(s): GLUCAP in the last 168 hours.  Recent Results (from the past 240 hour(s))  MRSA PCR Screening     Status: None   Collection Time: 06/27/14  5:46 AM  Result Value Ref Range Status   MRSA by PCR NEGATIVE NEGATIVE Final    Comment:        The GeneXpert MRSA Assay (FDA approved for NASAL specimens only), is one component of a comprehensive MRSA colonization surveillance program. It is not intended to diagnose MRSA infection nor to guide or monitor treatment for MRSA infections.      Studies:  Recent x-ray studies have been reviewed in detail by the Attending Physician  Scheduled Meds:  Scheduled Meds: . azithromycin  500 mg Intravenous QHS  . cefTRIAXone (ROCEPHIN)  IV  1  g Intravenous QHS  . latanoprost  1 drop Both Eyes QHS  . levothyroxine  75 mcg Oral QAC breakfast    Time spent on care of this patient: 40 mins   Allie Bossier [facility]  [facility] Office  [phone] Pager - [phone]  On-Call/Text Page:      [email or web address]      password [email or web address]  If 7PM-7AM, please contact night-coverage [email or web address] Password [email or web address] 06/27/2014, 12:24 PM   LOS: 1 day   Care during the described time interval was provided by me .  I have reviewed this patient's available data, including medical history, events of note, physical examination, radiology studies and test results as part of my evaluation  Dia Crawford, MD [phone] Pager

## 2014-06-27 NOTE — Progress Notes (Signed)
Pt arrived to the floor unresponsive. Pt would not with drawl from painful stimuli or sternal rub. Pupils equal and reactive. MD notified and came to bedside. Pt opened eyes, no communication. Will continue to monitor.

## 2014-06-27 NOTE — ED Notes (Signed)
Patient transported to [facility].

## 2014-06-27 NOTE — Care Management Note (Addendum)
    Page 1 of 1   07/03/2014     10:11:15 AM CARE MANAGEMENT NOTE 07/03/2014  Patient:  Stephanie [name], Stephanie [name]   Account Number:  [id number]  Date Initiated:  06/27/2014  Documentation initiated by:  Elissa Hefty  Subjective/Objective Assessment:   adm w subdural hematoma     Action/Plan:   was at home pta, pcp dr Derrill Memo   Anticipated DC Date:  07/05/2014   Anticipated DC Plan:  [place] referral  Clinical Social Worker  Hospice / [place] [facility]         Choice offered to / List presented to:             Status of service:   Medicare Important Message given?  YES (If response is "NO", the following Medicare IM given date fields will be blank) Date Medicare IM given:  06/29/2014 Medicare IM given by:  Elissa Hefty Date Additional Medicare IM given:  07/03/2014 Additional Medicare IM given by:  [facility] L[facility]in Alfred  Discharge Disposition:    Per UR Regulation:  Reviewed for med. necessity/level of care/duration of stay  If discussed at [place] of Stay Meetings, dates discussed:   07/04/2014    Comments:  2/12 1520 debbie Selinda Korzeniewski rn,bsn sw was working on residential hospice but now pt seems to be improving and sw to work on snf. will cont to [place].

## 2014-06-27 NOTE — Progress Notes (Signed)
Consult received and cahrt reviewed. Saw Stephanie [name] briefly this morning but she is unable to provide any history.  Attempted to call husband but no answer. Called son Fritz Pickerel [phone] but also no answer.  I spoke with nursing who have attempted to reach out to family and left messages with no response.  I have asked them to call me if family does arrive.    No Charge  Doran Clay D.O. Palliative Medicine Team at [facility]  Pager: [phone] Team Phone: [phone]

## 2014-06-27 NOTE — Progress Notes (Signed)
  Echocardiogram 2D Echocardiogram has been performed.  Stephanie [name] 06/27/2014, 8:57 AM

## 2014-06-27 NOTE — Progress Notes (Signed)
PT Cancellation Note  Patient Details Name: Stephanie [name] MRN: [id number] DOB: November 17, 1922   Cancelled Treatment:    Reason Eval/Treat Not Completed: Other (comment) (Pt with bedrest orders written concurrently with OOB orders.) Will eval when activity orders clarified.   [name] 06/27/2014, 9:42 AM  [place]

## 2014-06-27 NOTE — ED Notes (Signed)
Patient transferred to hospital bed.  

## 2014-06-28 DIAGNOSIS — W19XXXD Unspecified fall, subsequent encounter: Secondary | ICD-10-CM

## 2014-06-28 LAB — GLUCOSE, CAPILLARY
GLUCOSE-CAPILLARY: 76 mg/dL (ref 70–99)
Glucose-Capillary: 88 mg/dL (ref 70–99)
Glucose-Capillary: 79 mg/dL (ref 70–99)
Glucose-Capillary: 138 mg/dL — ABNORMAL HIGH (ref 70–99)

## 2014-06-28 LAB — T4, FREE: FREE T4: 1.24 ng/dL (ref 0.80–1.80)

## 2014-06-28 MED ORDER — CHLORHEXIDINE GLUCONATE 0.12 % MT SOLN
15.0000 mL | Freq: Two times a day (BID) | OROMUCOSAL | Status: DC
  Administered 2014-06-28 – 2014-07-03 (×11): 15 mL via OROMUCOSAL
  Filled 2014-06-28 (×6): qty 15

## 2014-06-28 MED ORDER — SODIUM CHLORIDE 0.9 % IV SOLN
INTRAVENOUS | Status: DC
  Administered 2014-06-28 (×2): via INTRAVENOUS

## 2014-06-28 MED ORDER — LEVOTHYROXINE SODIUM 75 MCG PO TABS
75.0000 ug | ORAL_TABLET | Freq: Every day | ORAL | Status: DC
  Filled 2014-06-28: qty 1

## 2014-06-28 MED ORDER — LEVOTHYROXINE SODIUM 100 MCG IV SOLR
37.5000 ug | Freq: Every day | INTRAVENOUS | Status: DC
  Administered 2014-06-29: 37.5 ug via INTRAVENOUS
  Filled 2014-06-28: qty 5

## 2014-06-28 MED ORDER — CETYLPYRIDINIUM CHLORIDE 0.05 % MT LIQD
7.0000 mL | Freq: Two times a day (BID) | OROMUCOSAL | Status: DC
  Administered 2014-06-28 – 2014-07-03 (×11): 7 mL via OROMUCOSAL

## 2014-06-28 MED ORDER — HALOPERIDOL LACTATE 5 MG/ML IJ SOLN
2.0000 mg | Freq: Once | INTRAMUSCULAR | Status: AC
  Administered 2014-06-28: 2 mg via INTRAMUSCULAR
  Filled 2014-06-28: qty 1

## 2014-06-28 MED ORDER — HALOPERIDOL LACTATE 5 MG/ML IJ SOLN
1.0000 mg | Freq: Four times a day (QID) | INTRAMUSCULAR | Status: DC | PRN
  Administered 2014-06-29 – 2014-06-30 (×3): 2 mg via INTRAVENOUS
  Filled 2014-06-28 (×4): qty 1

## 2014-06-28 MED ORDER — METOPROLOL TARTRATE 1 MG/ML IV SOLN
2.5000 mg | Freq: Four times a day (QID) | INTRAVENOUS | Status: DC
  Administered 2014-06-28 – 2014-06-29 (×3): 2.5 mg via INTRAVENOUS
  Filled 2014-06-28 (×6): qty 5

## 2014-06-28 NOTE — Progress Notes (Signed)
Palliative team will meet with patient and Equilla Que (BIL) at 330PM 06/29/2014.  Lane Hacker, DO Palliative Medicine [phone]

## 2014-06-28 NOTE — Progress Notes (Signed)
   06/28/14 1500  Clinical Encounter Type  Visited With Patient and family together  Visit Type Initial  Referral From Physician  Spiritual Encounters  Spiritual Needs Emotional  Stress Factors  Family Stress Factors Health changes;Lack of knowledge   Chaplain was referred to patient via spiritual care consult. Consult indicated that patient would like to complete or update an advanced directive with the help of her brother-in-law.  Chaplain was paged and notified that patient's brother-in-law had arrived and was at bedside. Chaplain introduced himself to patient's brother-in-law and the patient. Patient was awake and seemed to be happy to have visitors. Patient was really talkative but most of her speech was unintelligible. At one point, it was clear that the patient was confused about who I was and who her brother-in-law was after several introductions. Patient's brother-in-law said she has been like that for two days and likely isn't aware where she is at the moment. Chaplain explained a little bit about the contents of an advanced directive and further explained a little bit about why it was not possible for the patient to complete one at this time. Patient's brother-in-law understood that the patient needs to be aware and competent in order to complete her own advanced directive. Chaplain explained to patient's brother-in-law that he would be happy to facilitate the completion of these documents at a later time if it is possible. Brother-in-law has left for the day. Chaplain has been in contact with patient's nurse, attending physician, and the palliative care team regarding visit. Please contact if additional support needed. Gar Ponto, Chaplain  3:39 PM

## 2014-06-28 NOTE — Progress Notes (Signed)
Rehab Admissions Coordinator Note:  Patient was screened by Retta Diones for appropriateness for an Inpatient Acute Rehab Consult.  At this time, we are recommending Inpatient Rehab consult.  Jodell Cipro M 06/28/2014, 1:35 PM  I can be reached at [phone].

## 2014-06-28 NOTE — Progress Notes (Signed)
[place] TEAM 1 - Stepdown/ICU TEAM Progress Note  Stephanie [name] NTI:[id number] DOB: 06-13-1922 DOA: 06/26/2014 PCP: Mayra Neer, MD  Admit HPI / Brief Narrative: 81 y.o. female w/ hx hypertension, hypothyroidism, breast cancer S/P right mastectomy who was brought to the ER after patient was found to have had a fall in a parking lot. Patient was found to have facial hematoma and was brought to the ER. CT head showed bilateral subdural hematomas along with a large facial hematoma. On-call neurosurgeon Dr. Trenton Gammon was consulted and suggested conservative management. Patient's EKG showed atrial fibrillation.  HPI/Subjective: Pt is alert but her speech is garbled and nonsensical, in a "word salad" pattern.  She remains in a cervical collar.  She does not appear to be in pain, nor does she appear to be sob.  She is not able to provide a reliable hx.    Assessment/Plan:  Bilateral subdural hematoma  -status post fall  -manage conservatively as per Neurosurgery -pt is improving in regard to alertness, but appears to have significant neurologic injuries due to her fall - will obtain f/u CT head in AM for prognostic purposes   Acute encephalopathy -Secondary to SDH -as noted above, she is now more alert but significantly confused   Newly diagnosed Atrial fibrillation  -rate controlled -not a candidate for anticoagulation secondary to SDH  Renal failure - acute v/s chronic kidney disease stage III -gently hydrate and recheck in AM   Large facial hematoma from fall  -maxillofacial CT does not show any fractures.  -resolving with much less swelling today -cervical spine w/o clear acute injury so removed cervical collar   Hypothyroidism -TSH 9.5 but FT4 is normal  -follow w/o change in synthroid in setting of afib   HTN -Allow permissive hypertension secondary to SDH  -Hydralazine PRN SBP> 180 or DBP> 105           Hypokalemia -follow   Hypomagnesemia -recheck in AM   ?LLL CAP    Cont empiric abx tx   Code Status: NO CODE - DNR Family Communication: Spoke with son in law at bedside  Disposition Plan:  SDU over night - if remains stable, plan for transfer to Neuro floor in AM  Consultants: Neurosurgery Palliative Care  Procedure/Significant Events: 2/8 CT C-spine and CT head without contrast;Bilateral acute subdural hematomas, Rt > Lt . No significant mass effect or midline shift - Large soft tissue hematoma over the right side of the face - Slight anterior subluxation of C2 on C3 and C7 on T1, likely degenerative but ligamentous injury not excluded.  2/9 TTE - Left ventricle: Hypokinesis base inferior wall. LVEF=50%.-Right/Left atrium:  mildly dilated  Tricuspid valve: mild-moderate regurgitation - Pulmonary arteries: PA peak pressure: 35 mm Hg (S).  Antibiotics: Azithromycin 2/9 > Ceftriaxone 2/9 >  DVT prophylaxis: SCDs  Objective: Blood pressure 161/81, pulse 99, temperature 100.5 F (38.1 C), temperature source Oral, resp. rate 25, height 5\' 2"  (1.575 m), weight 48.2 kg (106 lb 4.2 oz), SpO2 90 %.  Intake/Output Summary (Last 24 hours) at 06/28/14 1412 Last data filed at 06/28/14 1300  Gross per 24 hour  Intake   2520 ml  Output   1175 ml  Net   1345 ml   Exam: General: alert w/ garbled speech - no acute resp distress  Lungs: Clear to auscultation bilaterally without wheezes or crackles Cardiovascular: Irregularly irregular without murmur gallop or rub normal S1 and S2 Abdomen: Nontender, nondistended, soft, bowel sounds positive, no rebound, no ascites,  no appreciable mass Extremities: No significant cyanosis, clubbing, or edema bilateral lower extremities  Data Reviewed: Basic Metabolic Panel:  Recent Labs Lab 06/26/14 1818 06/27/14 0336 06/27/14 1445  NA 142 141  --   K 3.5 3.0* 3.4*  CL 106 105  --   CO2 25 29  --   GLUCOSE 96 139*  --   BUN 23 19  --   CREATININE 1.44* 1.36*  --   CALCIUM 8.7 8.3*  --   MG  --   --  1.4*    Liver Function Tests:  Recent Labs Lab 06/27/14 0336  AST 25  ALT 15  ALKPHOS 65  BILITOT 0.9  PROT 5.0*  ALBUMIN 3.0*   CBC:  Recent Labs Lab 06/26/14 1818 06/27/14 0336  WBC 8.4 10.9*  NEUTROABS 6.8 9.8*  HGB 12.9 11.6*  HCT 39.0 34.9*  MCV 89.2 89.7  PLT 217 208   Cardiac Enzymes:  Recent Labs Lab 06/27/14 0336 06/27/14 1006 06/27/14 1445  TROPONINI 0.04* 0.04* 0.03   CBG:  Recent Labs Lab 06/28/14 0413 06/28/14 0751 06/28/14 1159  GLUCAP 88 79 76    Recent Results (from the past 240 hour(s))  MRSA PCR Screening     Status: None   Collection Time: 06/27/14  5:46 AM  Result Value Ref Range Status   MRSA by PCR NEGATIVE NEGATIVE Final    Comment:        The GeneXpert MRSA Assay (FDA approved for NASAL specimens only), is one component of a comprehensive MRSA colonization surveillance program. It is not intended to diagnose MRSA infection nor to guide or monitor treatment for MRSA infections.      Studies:  Recent x-ray studies have been reviewed in detail by the Attending Physician  Scheduled Meds:  Scheduled Meds: . antiseptic oral rinse  7 mL Mouth Rinse q12n4p  . azithromycin  500 mg Intravenous QHS  . cefTRIAXone (ROCEPHIN)  IV  1 g Intravenous QHS  . chlorhexidine  15 mL Mouth Rinse BID  . latanoprost  1 drop Both Eyes QHS  . levothyroxine  100 mcg Oral QAC breakfast    Time spent on care of this patient: 35 mins  Cherene Altes, MD [facility] For Consults/Admissions - Flow Manager - [phone] Office  [phone]  Contact MD directly via text page:      [email or web address]      password [facility]  06/28/2014, 2:12 PM   LOS: 2 days

## 2014-06-28 NOTE — Evaluation (Signed)
Physical Therapy Evaluation Patient Details Name: Stephanie [name] MRN: [id number] DOB: July 03, 1922 Today's Date: 06/28/2014   History of Present Illness  Pt adm with bilateral SDH rt greater than lt without mass effect or shift and large rt facial hematoma after fall (probable syncope). PMH - hypertension,hypothyroidism, breast cancer right   Clinical Impression  Pt admitted with above diagnosis. Pt currently with functional limitations due to the deficits listed below (see PT Problem List).  Pt will benefit from skilled PT to increase their independence and safety with mobility to allow discharge to the venue listed below.  Pt with significant language impairment and ADL impairments. Recommend OT and Speech therapy consults as well as CIR consult. Unsure of family support available at DC.     Follow Up Recommendations CIR    Equipment Recommendations  None recommended by PT (to be assessed)    Recommendations for Other Services Rehab consult;OT consult;Speech consult     Precautions / Restrictions Precautions Precautions: Fall Precaution Comments: Believe collar in place until someone clears cervical spine. Required Braces or Orthoses: Cervical Brace Cervical Brace: Hard collar Restrictions Weight Bearing Restrictions: No      Mobility  Bed Mobility Overal bed mobility: Needs Assistance Bed Mobility: Rolling;Sidelying to Sit;Sit to Supine Rolling: Min guard Sidelying to sit: Min assist   Sit to supine: Mod assist   General bed mobility comments: Verbal, tactile, visual cues to initiate. Assist to bring trunk up into sitting. Assist to bring legs back up into bed.  Transfers Overall transfer level: Needs assistance Equipment used: 1 person hand held assist Transfers: Sit to/from Stand Sit to Stand: Min assist         General transfer comment: Verbal, tactile, visual cues to initiate. Assist to bring hips up and for balance. Pt holding my forearms for  support.  Ambulation/Gait             General Gait Details: Unable to attempt safely with 1 person with pt's lines/tubes and cognition.  Stairs            Wheelchair Mobility    Modified Rankin (Stroke Patients Only)       Balance Overall balance assessment: Needs assistance Sitting-balance support: Single extremity supported Sitting balance-Leahy Scale: Poor Sitting balance - Comments: Assist for safety and unpredicatability.   Standing balance support: Bilateral upper extremity supported Standing balance-Leahy Scale: Poor Standing balance comment: bilateral forearm support.                             Pertinent Vitals/Pain Pain Assessment: Faces Faces Pain Scale: No hurt    Home Living Family/patient expects to be discharged to:: Unsure Living Arrangements: Alone               Additional Comments: Per chart lives alone but unable to get details due to impaired communication.    Prior Function Level of Independence: Independent         Comments: Lived alone and evidently drives because she fell in a parking lot.     Hand Dominance        Extremity/Trunk Assessment   Upper Extremity Assessment: Defer to OT evaluation           Lower Extremity Assessment: Generalized weakness;Difficult to assess due to impaired cognition         Communication   Communication: Receptive difficulties;Expressive difficulties  Cognition Arousal/Alertness: Awake/alert Behavior During Therapy: Restless Overall Cognitive Status: Difficult to assess  Area of Impairment: Attention;Following commands   Current Attention Level: Focused   Following Commands: Follows one step commands inconsistently       General Comments: Pt able to follow some commands with verbal, tactile, and visual cuing. Pt very distracted by multiple lines and tubes.    General Comments      Exercises        Assessment/Plan    PT Assessment Patient needs  continued PT services  PT Diagnosis Difficulty walking;Abnormality of gait;Altered mental status   PT Problem List Decreased strength;Decreased activity tolerance;Decreased balance;Decreased mobility;Decreased knowledge of precautions;Decreased knowledge of use of DME;Decreased cognition  PT Treatment Interventions DME instruction;Balance training;Gait training;Cognitive remediation;Functional mobility training;Patient/family education;Therapeutic activities;Therapeutic exercise;Neuromuscular re-education   PT Goals (Current goals can be found in the Care Plan section) Acute Rehab PT Goals Patient Stated Goal: pt unable to state PT Goal Formulation: Patient unable to participate in goal setting Time For Goal Achievement: 07/12/14 Potential to Achieve Goals: Good    Frequency Min 3X/week   Barriers to discharge Decreased caregiver support      Co-evaluation               End of Session Equipment Utilized During Treatment: Cervical collar Activity Tolerance: Patient tolerated treatment well Patient left: in bed;with call bell/phone within reach;with bed alarm set           Time: 8242-3536 PT Time Calculation (min) (ACUTE ONLY): 16 min   Charges:   PT Evaluation $Initial PT Evaluation Tier I: 1 Procedure     PT G Codes:        Kien Mirsky 06/30/14, 10:09 AM  Suanne Marker PT [phone]

## 2014-06-29 DIAGNOSIS — J439 Emphysema, unspecified: Secondary | ICD-10-CM | POA: Diagnosis present

## 2014-06-29 DIAGNOSIS — Z515 Encounter for palliative care: Secondary | ICD-10-CM

## 2014-06-29 LAB — COMPREHENSIVE METABOLIC PANEL
ALT: 21 U/L (ref 0–35)
AST: 39 U/L — ABNORMAL HIGH (ref 0–37)
Albumin: 2.9 g/dL — ABNORMAL LOW (ref 3.5–5.2)
Alkaline Phosphatase: 71 U/L (ref 39–117)
Anion gap: 9 (ref 5–15)
BUN: 22 mg/dL (ref 6–23)
CALCIUM: 9 mg/dL (ref 8.4–10.5)
CO2: 24 mmol/L (ref 19–32)
Chloride: 104 mmol/L (ref 96–112)
Creatinine, Ser: 1.52 mg/dL — ABNORMAL HIGH (ref 0.50–1.10)
GFR calc Af Amer: 34 mL/min — ABNORMAL LOW (ref >90)
GFR, EST NON AFRICAN AMERICAN: 29 mL/min — AB (ref >90)
GLUCOSE: 159 mg/dL — AB (ref 70–99)
Potassium: 4.4 mmol/L (ref 3.5–5.1)
Sodium: 137 mmol/L (ref 135–145)
Total Bilirubin: 1 mg/dL (ref 0.3–1.2)
Total Protein: 5.4 g/dL — ABNORMAL LOW (ref 6.0–8.3)

## 2014-06-29 LAB — POCT I-STAT, CHEM 8
BUN: 26 mg/dL — ABNORMAL HIGH (ref 6–23)
Calcium, Ion: 1.11 mmol/L — ABNORMAL LOW (ref 1.13–1.30)
Chloride: 105 mmol/L (ref 96–112)
Creatinine, Ser: 1.3 mg/dL — ABNORMAL HIGH (ref 0.50–1.10)
Glucose, Bld: 103 mg/dL — ABNORMAL HIGH (ref 70–99)
HEMATOCRIT: 45 % (ref 36.0–46.0)
HEMOGLOBIN: 15.3 g/dL — AB (ref 12.0–15.0)
Potassium: 3.8 mmol/L (ref 3.5–5.1)
SODIUM: 144 mmol/L (ref 135–145)
TCO2: 23 mmol/L (ref 0–100)

## 2014-06-29 LAB — MAGNESIUM: MAGNESIUM: 2 mg/dL (ref 1.5–2.5)

## 2014-06-29 MED ORDER — MORPHINE BOLUS VIA INFUSION
2.0000 mg | INTRAVENOUS | Status: DC | PRN
  Filled 2014-06-29: qty 2

## 2014-06-29 MED ORDER — MORPHINE SULFATE (CONCENTRATE) 10 MG/0.5ML PO SOLN
5.0000 mg | Freq: Four times a day (QID) | ORAL | Status: DC
  Administered 2014-06-29 – 2014-07-01 (×6): 5 mg via ORAL
  Filled 2014-06-29 (×6): qty 0.5

## 2014-06-29 MED ORDER — LORAZEPAM 2 MG/ML IJ SOLN
1.0000 mg | INTRAMUSCULAR | Status: DC | PRN
  Administered 2014-06-30: 1 mg via INTRAVENOUS
  Administered 2014-07-01: 0.5 mg via INTRAVENOUS
  Filled 2014-06-29 (×2): qty 1

## 2014-06-29 MED ORDER — LEVOTHYROXINE SODIUM 75 MCG PO TABS
75.0000 ug | ORAL_TABLET | Freq: Every day | ORAL | Status: DC
  Filled 2014-06-29: qty 1

## 2014-06-29 MED ORDER — SENNOSIDES-DOCUSATE SODIUM 8.6-50 MG PO TABS
1.0000 | ORAL_TABLET | Freq: Every day | ORAL | Status: DC
  Administered 2014-06-29 – 2014-06-30 (×2): 1 via ORAL
  Filled 2014-06-29 (×2): qty 1

## 2014-06-29 MED ORDER — LEVOTHYROXINE SODIUM 100 MCG PO TABS
100.0000 ug | ORAL_TABLET | Freq: Every day | ORAL | Status: DC
  Administered 2014-06-30 – 2014-07-03 (×4): 100 ug via ORAL
  Filled 2014-06-29 (×5): qty 1

## 2014-06-29 MED ORDER — METOPROLOL TARTRATE 25 MG PO TABS
25.0000 mg | ORAL_TABLET | Freq: Two times a day (BID) | ORAL | Status: DC
  Administered 2014-06-29 – 2014-07-01 (×4): 25 mg via ORAL
  Filled 2014-06-29 (×6): qty 1

## 2014-06-29 NOTE — Clinical Social Work Psychosocial (Signed)
     Clinical Social Work Department BRIEF PSYCHOSOCIAL ASSESSMENT 06/29/2014  Patient:  Stephanie [name], Stephanie [name]     Account Number:  [id number]     Admit date:  06/26/2014  Clinical Social Worker:  Adair Laundry  Date/Time:  06/29/2014 05:24 PM  Referred by:  Physician  Date Referred:  06/29/2014 Referred for  Residential hospice placement   Other Referral:   Interview type:  Family Other interview type:   Spoke with pt brother-in-law over the phone    PSYCHOSOCIAL DATA Living Status:  ALONE Admitted from facility:   Level of care:   Primary support name:  Pinkey Mcjunkin Primary support relationship to patient:  FAMILY Degree of support available:   Pt has limited support. Pt brother-in-law reports he is her main contact because pt brother is not in good health.    CURRENT CONCERNS Current Concerns  Post-Acute Placement   Other Concerns:    SOCIAL WORK ASSESSMENT / PLAN CSW received return phone call from pt brother-in-law Fritz Pickerel. Fritz Pickerel informed CSW he has just returned home from the hospital. CSW introduced self and explained role. Fritz Pickerel expressed that he is very thankful for everything that the hospital staff is doing for the patient. He informed CSW that his wife used to keep in contact with pt daily but after she passed away two years ago he started to call pt daily. Fritz Pickerel informed CSW that he is agreeable to what hospital staff is recommending and stated "yall know best". Fritz Pickerel did confirm that he believes residential hospice would be what pt would want if she could communicate for herself as well. CSW discussed referral process. Fritz Pickerel unable to commit to a second choice facility at this time and expressed that [facility] would be preferred over any other option. CSW offered support and understanding. CSW agreed to make referral to only [facility] and check on status tomorrow. Fritz Pickerel agreeable and thankful for additional time to think about options.   Assessment/plan  status:  Psychosocial Support/Ongoing Assessment of Needs Other assessment/ plan:   Information/referral to community resources:   Residential hospice list relayed verbally    PATIENTS/FAMILYS RESPONSE TO PLAN OF CARE: Pt brother-in-law very pleasant and agreeable to plan.      [place], [place]

## 2014-06-29 NOTE — Progress Notes (Addendum)
CSW (Clinical Education officer, museum) notified by Dr. Hilma Favors that plan will be for residential hospice and asked for CSW to make referral to [facility]. CSW attempted to call pt brother-in-law Fritz Pickerel [phone]) but unable to reach at this time. CSW did leave voicemail asking for return phone call. CSW made referral to [facility] based off information provided by palliative team note.  [place], [place]

## 2014-06-29 NOTE — Consult Note (Addendum)
Palliative Medicine Team Consult Note  81 yo woman with frailty, probable functional status decline prior to admission, hypothyroidism, atrial fibrillation and COPD was admitted on 2/8 after being found down s/p fall in a grocery store parking lot. She has no close living family except a brother-in-law who took over calling her every morning to "check on her" after his wife died 2 years ago-other than that, he has minimal contact with her and according to him his own health is poor and her only other relative is a brother who is "in really bad shape". He reports that prior to this admission she was very independent despite being frail-I also question her functional status since she appears to have clinical malnutrition and diffuse cerebral atrophy chronically with white matter changes. On arrival to the ED she was alert but could not give a history of events-CT showed significant bilateral subdural hematomas- NS felt she was not a good operative candidate given extent of bleeding and her age and frailty. She is now in the SDU, she appears to have both expressive and receptive dysphasia with the majority of her bleeding collecting in her left temporal region. She is extremely lethargic on my exam, HOH, diffuse bruising- she had had issues with severe agitation and last PM required a sitter- she has decline today and is much less interactive.   I met with her [place] to discuss goals of care. He understands her prognosis is very poor. He asks for physician guidance and also says that she would never want to live in a state of health where she had to be dependent on others for her care long term. She had also previously expressed a wish to not be on life support. Fritz Pickerel values her comfort and dignity and is most concerned about her getting the very best care possible-especially if she is approaching EOL.Fritz Pickerel had a prior experience with hospice care with his brother (the patient's husband) and feels that  is a good option given how bad her prognosis is in general.  Prognostication is very difficult, but I suspect she has uncontrolled pain manifesting as agitation- her soft tissue injuries are remarkable with diffuse bruising of her face and upper extremities. I anticipate a continued decline- per Fritz Pickerel she looks "even worse" today. Very likely <2 weeks. Has pain and agitation that need specialized attention. Suspect she may also be having temporal lobe seizures, sub-clinical.  Shift to full comfort care. Maat Kafer is agreeable to hospice facility referral.  Will follow.   Lane Hacker, DO Palliative Medicine [phone]  Time: 50 minutes 3:30PM- 4:20PM Greater than 50%  of this time was spent counseling and coordinating care related to the above assessment and plan.

## 2014-06-29 NOTE — Progress Notes (Signed)
[place] TEAM 1 - Stepdown/ICU TEAM Progress Note  SHELA ESSES XNA:[id number] DOB: 26-May-1922 DOA: 06/26/2014 PCP: Mayra Neer, MD  Admit HPI / Brief Narrative: Stephanie [name] is a 81 y.o. female PMHx hypertension,hypothyroidism, breast cancer right S/P right mastectomy was brought to the ER after patient was found to have had a fall in the parking lot. Patient was found to have facial hematoma and was brought to the ER. CT head shows bilateral subdural hematoma along with large facial hematoma. On-call neurosurgeon Dr. Trenton Gammon was consulted and at this time Dr. Trenton Gammon is planning conservative management and also family has requested that patient may not want to have surgical intervention. Patient's EKG shows atrial fibrillation probably new onset. Patient does not provide much history but denies any chest pain or shortness of breath. Patient is able to move all extremities. As per the history patient has originally gone to the pharmacy to get medications and usually lives alone.  HPI/Subjective: 2/11 A/O 0,  Assessment/Plan: Bilateral subdural hematoma  -status post fall  -manage conservatively as per the neurosurgery. Closely observe. Get PT/OT consult when patient able to participate .  Acute encephalopathy -Secondary to SDH -responds to painful stimuli only  Atrial fibrillation rate controlled - Rate controlled -not a candidate for anticoagulation secondary to SDH  Renal failure probably chronic kidney disease stage III  - gently hydrate normal saline 50 ml/hr -Hold diureticst.  Large facial hematoma from fall  - maxillofacial CT does not show any fractures.  -Somewhat resolving patient able to open right eye  -Lung consolidation in the chest x-ray/ CAP?  Subacute Hypothyroidism -TSH= 9.4 in the setting of normalfree T4= 1.24, constitute definition of subacute hypothyroidism which is a cause/exacerbate A. fib  -Continue Synthroid 100 g daily  HTN -Metoprolol 25 mg  BID -Allow permissive hypertension secondary to SDH            Hypokalemia -Potassium IV 10 mEq 4 runs  Hypomagnesemia - Magnesium goal>2gm  Goals of care -Will await recommendations from palliative care consult with family   Code Status: FULL Family Communication:  None; palliative care meeting held today Disposition Plan:??    Consultants: Dr.Henry A Pool (neurosurgery)  Procedure/Significant Events: 2/8 PCXR;Left lower lobe consolidation with small left effusion. Underlying emphysema.. 2/8 CT C-spine and CT head without contrast;Bilateral acute subdural hematomas, Rt > Lt . No significant mass effect or midline shift. -Large soft tissue hematoma over the right side of the face. -Slight anterior subluxation of C2 on C3 and C7 on T1, likely degenerative but ligamentous injury not excluded.  2/9 echocardiogram;- Left ventricle: Hypokinesis base inferior wall. LVEF=50%. -Right/Left atrium:  mildly dilated. - Tricuspid valve: mild-moderate regurgitation. - Pulmonary arteries: PA peak pressure: 35 mm Hg (S).    Culture 2/9 MRSA by PCR negative  Antibiotics: Azithromycin 2/9>> Ceftriaxone 2/9>>   DVT prophylaxis: SCD   Devices    LINES / TUBES:      Continuous Infusions:    Objective: VITAL SIGNS: Temp: 97.6 F (36.4 C) (02/11 1535) Temp Source: Axillary (02/11 1535) BP: 112/47 mmHg (02/11 1600) Pulse Rate: 97 (02/11 1535) SPO2; FIO2:   Intake/Output Summary (Last 24 hours) at 06/29/14 2158 Last data filed at 06/29/14 1841  Gross per 24 hour  Intake   1850 ml  Output    700 ml  Net   1150 ml     Exam: General: Opens Eye to stimulation, Follows some cmds (squeezes fingers), Word Salad. Answers yes to all questions.  Face  Bilat Hematoma Rt> Lt, No acute respiratory distress Lungs: Clear to auscultation bilaterally without wheezes or crackles Cardiovascular: Iregular Iregular rate and rhythm without murmur gallop or rub normal S1 and  S2 Abdomen: Nontender, nondistended, soft, bowel sounds positive, no rebound, no ascites, no appreciable mass Extremities: No significant cyanosis, clubbing, or edema bilateral lower extremities Neorologic: CNII- XII intact. Responds Painful stimuli, Word Salad, Follows Some Cmds. PERRLA.  Data Reviewed: Basic Metabolic Panel:  Recent Labs Lab 06/26/14 1731 06/26/14 1818 06/27/14 0336 06/27/14 1445 06/29/14 0322  NA 144 142 141  --  137  K 3.8 3.5 3.0* 3.4* 4.4  CL 105 106 105  --  104  CO2  --  25 29  --  24  GLUCOSE 103* 96 139*  --  159*  BUN 26* 23 19  --  22  CREATININE 1.30* 1.44* 1.36*  --  1.52*  CALCIUM  --  8.7 8.3*  --  9.0  MG  --   --   --  1.4* 2.0   Liver Function Tests:  Recent Labs Lab 06/27/14 0336 06/29/14 0322  AST 25 39*  ALT 15 21  ALKPHOS 65 71  BILITOT 0.9 1.0  PROT 5.0* 5.4*  ALBUMIN 3.0* 2.9*   No results for input(s): LIPASE, AMYLASE in the last 168 hours. No results for input(s): AMMONIA in the last 168 hours. CBC:  Recent Labs Lab 06/26/14 1731 06/26/14 1818 06/27/14 0336  WBC  --  8.4 10.9*  NEUTROABS  --  6.8 9.8*  HGB 15.3* 12.9 11.6*  HCT 45.0 39.0 34.9*  MCV  --  89.2 89.7  PLT  --  217 208   Cardiac Enzymes:  Recent Labs Lab 06/27/14 0336 06/27/14 1006 06/27/14 1445  TROPONINI 0.04* 0.04* 0.03   BNP (last 3 results) No results for input(s): BNP in the last 8760 hours.  ProBNP (last 3 results) No results for input(s): PROBNP in the last 8760 hours.  CBG:  Recent Labs Lab 06/28/14 0413 06/28/14 0751 06/28/14 1159 06/28/14 1646  GLUCAP 88 79 76 138*    Recent Results (from the past 240 hour(s))  MRSA PCR Screening     Status: None   Collection Time: 06/27/14  5:46 AM  Result Value Ref Range Status   MRSA by PCR NEGATIVE NEGATIVE Final    Comment:        The GeneXpert MRSA Assay (FDA approved for NASAL specimens only), is one component of a comprehensive MRSA colonization surveillance program. It  is not intended to diagnose MRSA infection nor to guide or monitor treatment for MRSA infections.      Studies:  Recent x-ray studies have been reviewed in detail by the Attending Physician  Scheduled Meds:  Scheduled Meds: . antiseptic oral rinse  7 mL Mouth Rinse q12n4p  . chlorhexidine  15 mL Mouth Rinse BID  . latanoprost  1 drop Both Eyes QHS  . [START ON 06/30/2014] levothyroxine  100 mcg Oral QAC breakfast  . metoprolol tartrate  25 mg Oral BID  . morphine CONCENTRATE  5 mg Oral 4 times per day  . senna-docusate  1 tablet Oral QHS    Time spent on care of this patient: 40 mins   Allie Bossier [facility]  [facility] Office  [phone] Pager - [phone]  On-Call/Text Page:      [email or web address]      password [email or web address]  If 7PM-7AM, please contact night-coverage [email or web address] Password [facility] 06/29/2014, 9:58 PM   LOS:  3 days   Care during the described time interval was provided by me .  I have reviewed this patient's available data, including medical history, events of note, physical examination, radiology studies and test results as part of my evaluation  Dia Crawford, MD [phone] Pager

## 2014-06-30 DIAGNOSIS — I482 Chronic atrial fibrillation: Secondary | ICD-10-CM

## 2014-06-30 MED ORDER — INFLUENZA VAC SPLIT QUAD 0.5 ML IM SUSY
0.5000 mL | PREFILLED_SYRINGE | INTRAMUSCULAR | Status: AC
  Administered 2014-07-01: 0.5 mL via INTRAMUSCULAR
  Filled 2014-06-30: qty 0.5

## 2014-06-30 NOTE — Progress Notes (Addendum)
[place] TEAM 1 - Stepdown/ICU TEAM Progress Note  Stephanie [name] YBO:[id number] DOB: [birth year] DOA: 06/26/2014 PCP: Mayra Neer, MD  Admit HPI / Brief Narrative: Stephanie [name] is a 81 y.o. female PMHx hypertension,hypothyroidism, breast cancer right S/P right mastectomy was brought to the ER after patient was found to have had a fall in the parking lot. Patient was found to have facial hematoma and was brought to the ER. CT head shows bilateral subdural hematoma along with large facial hematoma. On-call neurosurgeon Dr. Trenton Gammon was consulted and at this time Dr. Trenton Gammon is planning conservative management and also family has requested that patient may not want to have surgical intervention. Patient's EKG shows atrial fibrillation probably new onset. Patient does not provide much history but denies any chest pain or shortness of breath. Patient is able to move all extremities. As per the history patient has originally gone to the pharmacy to get medications and usually lives alone.  HPI/Subjective: 2/13 A/O 0, eating and drinking  Assessment/Plan: Bilateral subdural hematoma  -status post fall  -manage conservatively as per the neurosurgery. Closely observe. -Patient unlikely to improve enough to participate with PT/OT  Acute encephalopathy -Secondary to SDH -Patient awake alert however has expressive/receptive aphasia  Atrial fibrillation rate controlled - Rate controlled -not a candidate for anticoagulation secondary to SDH  Renal failure probably chronic kidney disease stage III -DC labs, patient eating and drinking  Large facial hematoma from fall  - maxillofacial CT does not show any fractures.  -Able to open bilateral eyes   -Lung consolidation in the chest x-ray/ CAP?  Subacute Hypothyroidism -TSH= 9.4 in the setting of normalfree T4= 1.24, constitutes definition of subacute hypothyroidism which could cause/exacerbate A. fib  -Continue Synthroid 100 g  daily  HTN -Metoprolol 25 mg BID -Allow permissive hypertension secondary to SDH            Hypokalemia -Potassium IV 10 mEq 4 runs  Hypomagnesemia - Magnesium goal>2gm  Goals of care -Spoke at length with Dr. Rhea Pink palliative care who has been speaking with family. Proposed to wait until Monday to see if any improvement in patient before making final decision on disposition. I have agreed. Moving toward maintain patient's comfort. Will stop all labs   Code Status: FULL Family Communication:  None;  Disposition Plan:SNF ??    Consultants: Dr.Henry A Pool (neurosurgery)  Dr. Rhea Pink (palliative care)   Procedure/Significant Events: 2/8 PCXR;Left lower lobe consolidation with small left effusion. Underlying emphysema.. 2/8 CT C-spine and CT head without contrast;Bilateral acute subdural hematomas, Rt > Lt . No significant mass effect or midline shift. -Large soft tissue hematoma over the right side of the face. -Slight anterior subluxation of C2 on C3 and C7 on T1, likely degenerative but ligamentous injury not excluded.  2/9 echocardiogram;- Left ventricle: Hypokinesis base inferior wall. LVEF=50%. -Right/Left atrium:  mildly dilated. - Tricuspid valve: mild-moderate regurgitation. - Pulmonary arteries: PA peak pressure: 35 mm Hg (S).    Culture 2/9 MRSA by PCR negative  Antibiotics: Azithromycin 2/9>> stopped 2/11 Ceftriaxone 2/9>> stopped 2/11   DVT prophylaxis: SCD   Devices    LINES / TUBES:      Continuous Infusions:   Objective: VITAL SIGNS:   SPO2; FIO2:  No intake or output data in the 24 hours ending 07/06/14 2325   Exam: General: Opens Eye to stimulation, does not follow commands Word Salad. Answers yes to all questions.  Face Bilat Hematoma Rt> Lt (beginning to resolve), No acute  respiratory distress Lungs: Clear to auscultation bilaterally without wheezes or crackles Cardiovascular: Iregular Iregular rate and rhythm  without murmur gallop or rub normal S1 and S2 Abdomen: Nontender, nondistended, soft, bowel sounds positive, no rebound, no ascites, no appreciable mass Extremities: No significant cyanosis, clubbing, or edema bilateral lower extremities Neorologic: CNII- XII intact. Responds Painful stimuli, Word Salad, appears may also have receptive aphasia, PERRLA. Answers yes to all questions  Data Reviewed: Basic Metabolic Panel: No results for input(s): NA, K, CL, CO2, GLUCOSE, BUN, CREATININE, CALCIUM, MG, PHOS in the last 168 hours. Liver Function Tests: No results for input(s): AST, ALT, ALKPHOS, BILITOT, PROT, ALBUMIN in the last 168 hours. No results for input(s): LIPASE, AMYLASE in the last 168 hours. No results for input(s): AMMONIA in the last 168 hours. CBC: No results for input(s): WBC, NEUTROABS, HGB, HCT, MCV, PLT in the last 168 hours. Cardiac Enzymes: No results for input(s): CKTOTAL, CKMB, CKMBINDEX, TROPONINI in the last 168 hours. BNP (last 3 results) No results for input(s): BNP in the last 8760 hours.  ProBNP (last 3 results) No results for input(s): PROBNP in the last 8760 hours.  CBG: No results for input(s): GLUCAP in the last 168 hours.  Recent Results (from the past 240 hour(s))  MRSA PCR Screening     Status: None   Collection Time: 06/27/14  5:46 AM  Result Value Ref Range Status   MRSA by PCR NEGATIVE NEGATIVE Final    Comment:        The GeneXpert MRSA Assay (FDA approved for NASAL specimens only), is one component of a comprehensive MRSA colonization surveillance program. It is not intended to diagnose MRSA infection nor to guide or monitor treatment for MRSA infections.      Studies:  Recent x-ray studies have been reviewed in detail by the Attending Physician  Scheduled Meds:  Scheduled Meds:   Time spent on care of this patient: 40 mins   Allie Bossier [facility]  [facility] Office  [phone] Pager -  [phone]  On-Call/Text Page:      [email or web address]      password [email or web address]  If 7PM-7AM, please contact night-coverage [email or web address] Password [facility] 07/06/2014, 11:25 PM   LOS: 7 days   Care during the described time interval was provided by me .  I have reviewed this patient's available data, including medical history, events of note, physical examination, radiology studies and test results as part of my evaluation  Dia Crawford, MD [phone] Pager

## 2014-06-30 NOTE — Clinical Social Work Placement (Addendum)
    Clinical Social Work Department CLINICAL SOCIAL WORK PLACEMENT NOTE 06/30/2014  Patient:  Stephanie [name], Stephanie [name]  Account Number:  [id number] Admit date:  06/26/2014  Clinical Social Worker:  Berton Mount, Latanya Presser  Date/time:  06/30/2014 03:15 PM  Clinical Social Work is seeking post-discharge placement for this patient at the following level of care:   SKILLED NURSING   (*CSW will update this form in Epic as items are completed)   06/30/2014  Patient/family provided with [facility] Department of Clinical Social Work's list of facilities offering this level of care within the geographic area requested by the patient (or if unable, by the patient's family).  06/30/2014  Patient/family informed of their freedom to choose among providers that offer the needed level of care, that participate in Medicare, Medicaid or managed care program needed by the patient, have an available bed and are willing to accept the patient.  06/30/2014  Patient/family informed of [facility]' ownership interest in [facility], as well as of the fact that they are under no obligation to receive care at this facility.  PASARR submitted to EDS on 06/30/2014 PASARR number received on 06/30/2014  FL2 transmitted to all facilities in geographic area requested by pt/family on  06/30/2014 FL2 transmitted to all facilities within larger geographic area on   Patient informed that his/her managed care company has contracts with or will negotiate with  certain facilities, including the following:     Patient/family informed of bed offers received:  06/30/2014 Patient chooses bed at ----- Physician recommends and patient chooses bed at    Patient to be transferred to [facility] on  07/03/2014 Patient to be transferred to facility by [facility] Patient and family notified of transfer on  Name of family member notified:  Mellody Dance  The following physician request were entered in Epic: Physician Request   Please sign FL2.    Additional CommentsBerton Mount, [place]

## 2014-06-30 NOTE — Progress Notes (Signed)
Pt appears restless, agitated; repeatedly talks about fixing something at the bottom of the bed; HR 150's with pt attempting to crawl out of bed; reorientation provided; haldol given PRN dose; will continue to closely montior

## 2014-06-30 NOTE — Progress Notes (Signed)
CSW (Clinical Education officer, museum) spoke with [facility] and they are able to offer a bed. Pt nurse aware that pt can dc over weekend if medically stable.  [place], [place]

## 2014-07-01 DIAGNOSIS — S065X9A Traumatic subdural hemorrhage with loss of consciousness of unspecified duration, initial encounter: Secondary | ICD-10-CM | POA: Diagnosis not present

## 2014-07-01 DIAGNOSIS — E038 Other specified hypothyroidism: Secondary | ICD-10-CM | POA: Diagnosis present

## 2014-07-01 DIAGNOSIS — G9340 Encephalopathy, unspecified: Secondary | ICD-10-CM | POA: Diagnosis present

## 2014-07-01 DIAGNOSIS — N189 Chronic kidney disease, unspecified: Secondary | ICD-10-CM

## 2014-07-01 DIAGNOSIS — I10 Essential (primary) hypertension: Secondary | ICD-10-CM | POA: Diagnosis present

## 2014-07-01 MED ORDER — METOPROLOL TARTRATE 25 MG/10 ML ORAL SUSPENSION
25.0000 mg | Freq: Two times a day (BID) | ORAL | Status: DC
  Administered 2014-07-02 – 2014-07-03 (×4): 25 mg via ORAL
  Filled 2014-07-01 (×6): qty 10

## 2014-07-01 MED ORDER — ACETAMINOPHEN 325 MG PO TABS
650.0000 mg | ORAL_TABLET | Freq: Three times a day (TID) | ORAL | Status: DC
  Administered 2014-07-03: 650 mg via ORAL
  Filled 2014-07-01: qty 2

## 2014-07-01 MED ORDER — MORPHINE SULFATE (CONCENTRATE) 10 MG/0.5ML PO SOLN
5.0000 mg | Freq: Three times a day (TID) | ORAL | Status: DC
  Administered 2014-07-01 – 2014-07-03 (×6): 5 mg via ORAL
  Filled 2014-07-01 (×6): qty 0.5

## 2014-07-01 MED ORDER — LORAZEPAM 2 MG/ML IJ SOLN
0.5000 mg | INTRAMUSCULAR | Status: DC | PRN
  Administered 2014-07-02 (×3): 0.5 mg via INTRAVENOUS
  Filled 2014-07-01 (×3): qty 1

## 2014-07-01 NOTE — Progress Notes (Signed)
Dr Sherral Hammers and Dr Hilma Favors at bedside. Pt unable to write on small whiteboard.Pt states"yes" to almost every question --- "soup salad" speech is freq .pupils equal and reactive w/good eye contact . Ativan 0.5 mg to be given IV post assessment( due to Pt trying to get OOB persistently ). Per Dr Bernadene Bell assessment can not be done with this Pt 's current status .

## 2014-07-01 NOTE — Progress Notes (Signed)
Pt did glance at her brother-in-law when instructed to do so. the patient stated "no " when asked whether she knows him .

## 2014-07-01 NOTE — Progress Notes (Signed)
Pt sleeping--arouses w/tactile stimulation.Bed alarm on  for safety.

## 2014-07-01 NOTE — Progress Notes (Addendum)
Nutrition Brief Note  Patient identified on the Malnutrition Screening Tool (MST) Report  Wt Readings from Last 15 Encounters:  06/27/14 106 lb 4.2 oz (48.2 kg)  06/23/11 104 lb 3.2 oz (47.265 kg)    Body mass index is 19.43 kg/(m^2). Patient meets criteria for normal BMI based on current BMI.   Current diet order is Regular, patient is consuming approximately 50-100% of meals at this time. Labs and medications reviewed.   Spoke to patient who lapsed in and out of consciousness and could only nod head. She nodded that her appetite was ok but had lost weight? Unsure of mental status. [place] 14. She had eaten most of her breakfast. Nurse states pts airway is clear after meal.   Per MD note 2/11 she is being shifted to full comfort care.  No nutrition interventions warranted at this time. If nutrition issues arise, please consult RD.   Burtis Junes RD, LDN Nutrition Pager: [phone] 07/01/2014 9:42 AM

## 2014-07-02 NOTE — Progress Notes (Signed)
Pt clenched teeth freq to prevent intake of po fluids and foods.Nutritional intake notably declined today ( compared to yesterday).

## 2014-07-02 NOTE — Progress Notes (Signed)
[place] TEAM 1 - Stepdown/ICU TEAM Progress Note  Stephanie [name] KZS:[id number] DOB: Oct 17, 1922 DOA: 06/26/2014 PCP: Stephanie Neer, MD  Admit HPI / Brief Narrative: Stephanie [name] is a 81 y.o. female PMHx hypertension,hypothyroidism, breast cancer right S/P right mastectomy was brought to the ER after patient was found to have had a fall in the parking lot. Patient was found to have facial hematoma and was brought to the ER. CT head shows bilateral subdural hematoma along with large facial hematoma. On-call neurosurgeon Stephanie [name] was consulted and at this time Stephanie [name] is planning conservative management and also family has requested that patient may not want to have surgical intervention. Patient's EKG shows atrial fibrillation probably new onset. Patient does not provide much history but denies any chest pain or shortness of breath. Patient is able to move all extremities. As per the history patient has originally gone to the pharmacy to get medications and usually lives alone.  HPI/Subjective: 2/14 A/O 0, eating and drinking, continued receptive and expressive aphasia  Assessment/Plan: Bilateral subdural hematoma  -status post fall  -manage conservatively as per the neurosurgery. Closely observe. -Patient unlikely to improve enough to participate with PT/OT -Patient continues to be confused EXTREME FALL RISK; [place] is the goal of care at this point  Acute encephalopathy -Secondary to SDH -Patient awake alert however has expressive/receptive aphasia  Atrial fibrillation rate controlled - Continue metoprolol 25 mg  BID; for comfort -not a candidate for anticoagulation secondary to SDH  Renal failure probably chronic kidney disease stage III -DC labs, patient eating and drinking  Large facial hematoma from fall  - maxillofacial CT does not show any fractures.  -Able to open bilateral eyes   -Lung consolidation in the chest x-ray/ CAP? -Comfort is the goal  of care at this point  Subacute Hypothyroidism -TSH= 9.4 in the setting of normalfree T4= 1.24, constitutes definition of subacute hypothyroidism which could cause/exacerbate A. fib  -Continue Synthroid 100 g daily  HTN -Metoprolol 25 mg BID -Allow permissive hypertension secondary to SDH            Hypokalemia -Potassium IV 10 mEq 4 runs -All labs discontinued  Hypomagnesemia - Magnesium goal>2gm -All labs discontinued  Goals of care -Spoke at length with Stephanie [name] palliative care who has been speaking with family. Proposed to wait until Monday to see if any improvement in patient before making final decision on disposition. I have agreed. Moving toward maintain patient's comfort. Will stop all labs   Code Status: FULL Family Communication:  None;  Disposition Plan:SNF ??    Consultants: StephanieHenry A [name] (neurosurgery)  Stephanie [name] (palliative care)   Procedure/Significant Events: 2/8 PCXR;Left lower lobe consolidation with small left effusion. Underlying emphysema.. 2/8 CT C-spine and CT head without contrast;Bilateral acute subdural hematomas, Rt > Lt . No significant mass effect or midline shift. -Large soft tissue hematoma over the right side of the face. -Slight anterior subluxation of C2 on C3 and C7 on T1, likely degenerative but ligamentous injury not excluded.  2/9 echocardiogram;- Left ventricle: Hypokinesis base inferior wall. LVEF=50%. -Right/Left atrium:  mildly dilated. - Tricuspid valve: mild-moderate regurgitation. - Pulmonary arteries: PA peak pressure: 35 mm Hg (S).    Culture 2/9 MRSA by PCR negative  Antibiotics: Azithromycin 2/9>> stopped 2/11 Ceftriaxone 2/9>> stopped 2/11   DVT prophylaxis: SCD   Devices    LINES / TUBES:      Continuous Infusions:    Objective: VITAL SIGNS:  Temp: 98.1 F (36.7 C) (02/14 1104) Temp Source: Oral (02/14 1104) BP: 111/49 mmHg (02/14 1104) SPO2; FIO2:   Intake/Output  Summary (Last 24 hours) at 07/02/14 1353 Last data filed at 07/02/14 0911  Gross per 24 hour  Intake    240 ml  Output      0 ml  Net    240 ml     Exam: General: Opens Eye to stimulation, does not follow commands Word Salad. Answers yes to all questions.  Face Bilat Hematoma Rt> Lt (resolving), No acute respiratory distress Lungs: Clear to auscultation bilaterally without wheezes or crackles Cardiovascular: Iregular Iregular rate and rhythm without murmur gallop or rub normal S1 and S2 Abdomen: Nontender, nondistended, soft, bowel sounds positive, no rebound, no ascites, no appreciable mass Extremities: No significant cyanosis, clubbing, or edema bilateral lower extremities Neorologic: CNII- XII intact. Responds Painful stimuli, Word Salad, receptive aphasia, PERRLA. Answers yes to all questions  Data Reviewed: Basic Metabolic Panel:  Recent Labs Lab 06/26/14 1731 06/26/14 1818 06/27/14 0336 06/27/14 1445 06/29/14 0322  NA 144 142 141  --  137  K 3.8 3.5 3.0* 3.4* 4.4  CL 105 106 105  --  104  CO2  --  25 29  --  24  GLUCOSE 103* 96 139*  --  159*  BUN 26* 23 19  --  22  CREATININE 1.30* 1.44* 1.36*  --  1.52*  CALCIUM  --  8.7 8.3*  --  9.0  MG  --   --   --  1.4* 2.0   Liver Function Tests:  Recent Labs Lab 06/27/14 0336 06/29/14 0322  AST 25 39*  ALT 15 21  ALKPHOS 65 71  BILITOT 0.9 1.0  PROT 5.0* 5.4*  ALBUMIN 3.0* 2.9*   No results for input(s): LIPASE, AMYLASE in the last 168 hours. No results for input(s): AMMONIA in the last 168 hours. CBC:  Recent Labs Lab 06/26/14 1731 06/26/14 1818 06/27/14 0336  WBC  --  8.4 10.9*  NEUTROABS  --  6.8 9.8*  HGB 15.3* 12.9 11.6*  HCT 45.0 39.0 34.9*  MCV  --  89.2 89.7  PLT  --  217 208   Cardiac Enzymes:  Recent Labs Lab 06/27/14 0336 06/27/14 1006 06/27/14 1445  TROPONINI 0.04* 0.04* 0.03   BNP (last 3 results) No results for input(s): BNP in the last 8760 hours.  ProBNP (last 3 results) No  results for input(s): PROBNP in the last 8760 hours.  CBG:  Recent Labs Lab 06/28/14 0413 06/28/14 0751 06/28/14 1159 06/28/14 1646  GLUCAP 88 79 76 138*    Recent Results (from the past 240 hour(s))  MRSA PCR Screening     Status: None   Collection Time: 06/27/14  5:46 AM  Result Value Ref Range Status   MRSA by PCR NEGATIVE NEGATIVE Final    Comment:        The GeneXpert MRSA Assay (FDA approved for NASAL specimens only), is one component of a comprehensive MRSA colonization surveillance program. It is not intended to diagnose MRSA infection nor to guide or monitor treatment for MRSA infections.      Studies:  Recent x-ray studies have been reviewed in detail by the Attending Physician  Scheduled Meds:  Scheduled Meds: . acetaminophen  650 mg Oral TID  . antiseptic oral rinse  7 mL Mouth Rinse q12n4p  . chlorhexidine  15 mL Mouth Rinse BID  . latanoprost  1 drop Both Eyes QHS  .  levothyroxine  100 mcg Oral QAC breakfast  . metoprolol tartrate  25 mg Oral BID  . morphine CONCENTRATE  5 mg Oral 3 times per day  . senna-docusate  1 tablet Oral QHS    Time spent on care of this patient: 40 mins   Allie Bossier [facility]  [facility] Office  [phone] Pager - [phone]  On-Call/Text Page:      [email or web address]      password [email or web address]  If 7PM-7AM, please contact night-coverage [email or web address] Password [email or web address] 07/02/2014, 1:53 PM   LOS: 6 days   Care during the described time interval was provided by me .  I have reviewed this patient's available data, including medical history, events of note, physical examination, radiology studies and test results as part of my evaluation  Dia Crawford, MD [phone] Pager

## 2014-07-03 DIAGNOSIS — S0083XD Contusion of other part of head, subsequent encounter: Secondary | ICD-10-CM

## 2014-07-03 MED ORDER — MORPHINE SULFATE (CONCENTRATE) 10 MG/0.5ML PO SOLN: 5.0000 mg | Freq: Three times a day (TID) | ORAL | Status: AC

## 2014-07-03 MED ORDER — ACETAMINOPHEN 650 MG RE SUPP: 650.0000 mg | Freq: Four times a day (QID) | RECTAL | Status: AC | PRN

## 2014-07-03 MED ORDER — LORAZEPAM 2 MG/ML IJ SOLN: 0.5000 mg | INTRAMUSCULAR | Status: AC | PRN

## 2014-07-03 MED ORDER — LORAZEPAM 2 MG/ML IJ SOLN: 0.5000 mg | INTRAMUSCULAR | Status: DC | PRN

## 2014-07-03 MED ORDER — HALOPERIDOL LACTATE 5 MG/ML IJ SOLN: 0.5000 mg | INTRAMUSCULAR | Status: AC

## 2014-07-03 MED ORDER — HALOPERIDOL LACTATE 5 MG/ML IJ SOLN
0.5000 mg | INTRAMUSCULAR | Status: DC
  Administered 2014-07-03: 0.5 mg via INTRAVENOUS
  Filled 2014-07-03: qty 1

## 2014-07-03 MED ORDER — METOPROLOL TARTRATE 25 MG/10 ML ORAL SUSPENSION: 25.0000 mg | Freq: Two times a day (BID) | ORAL | Status: AC

## 2014-07-03 MED ORDER — MORPHINE BOLUS VIA INFUSION: 2.0000 mg | INTRAVENOUS | Status: AC | PRN

## 2014-07-03 MED ORDER — SENNOSIDES-DOCUSATE SODIUM 8.6-50 MG PO TABS: 1.0000 | ORAL_TABLET | Freq: Every day | ORAL | Status: AC

## 2014-07-03 MED ORDER — ONDANSETRON HCL 4 MG/2ML IJ SOLN: 4.0000 mg | Freq: Four times a day (QID) | INTRAMUSCULAR | Status: AC | PRN

## 2014-07-03 MED ORDER — ACETAMINOPHEN 325 MG PO TABS: 650.0000 mg | ORAL_TABLET | Freq: Four times a day (QID) | ORAL | Status: AC | PRN

## 2014-07-03 MED ORDER — HALOPERIDOL LACTATE 5 MG/ML IJ SOLN
0.5000 mg | INTRAMUSCULAR | Status: DC
Start: 2014-07-03 — End: 2014-07-03

## 2014-07-03 NOTE — Consult Note (Signed)
[place] Liaison: Received request from [place] and Dr. Hilma Favors for [facility] transfer today due to decline over weekend. Spoke with brother-in-law Fritz Pickerel who is in agreement with transfer. Dr. Orpah Melter to assume care per [facility] request. Room is available today. Please fax discharge summary to [phone]. RN please call report to [phone]. Please arrange for patient to arrive as soon as possible. Thank you. Erling Conte LcSW [phone]

## 2014-07-03 NOTE — Progress Notes (Signed)
CSW (Clinical Education officer, museum) notified that pt has declined medically and is appropriate to consider for residential hospice placement again. CSW left voicemail for [employer]. Will await call back before making referral to second choice facility.  [place], [place]

## 2014-07-03 NOTE — Progress Notes (Signed)
Pt's bro in law here, [employer] paperwork signed, Dr. Thereasa Solo paged re discharge summary. Discharge summary done, SW ([place]) notified re discharge summary and need for transportation. Report called to Ms. Clarene Critchley. Ms. Harmon Pier [facility]) also notified re status. Will monitor closely.

## 2014-07-03 NOTE — Progress Notes (Signed)
Palliative Medicine Team Progress Note  Ms. Francom is declining especially in the last 24 hours. She is agitated and has much less purposeful activity. She required assistance with eating this AM and her intake has dropped dramatically.   Filed Vitals:   07/03/14 0800  BP: 153/73  Pulse:   Temp: 98.5 F (36.9 C)  Resp: 33   Echymoses-diffusely. Skin is very dry. She has tremors and is much less purposefully interactive-she isn't smiling at me today nor is she talking (or trying to talk).   Assessment: Acute decline in setting of severe subdural bleed. Worsening agitation. Pain from fall-facial swelling and pain on palpation of her neck. She is extremeley frail.  Recommendations:  I still believe her prognosis is <2 weeks. She is declining rapidly now. She is going to require scheduled medication for agitation and safety. She does not follow commands.  -scheduled haldol and ativan BID for seizure prophylaxis -continue low dose roxanol TID -explore hospice facility options -re-initiate referral  Lane Hacker, [place]

## 2014-07-03 NOTE — Discharge Summary (Signed)
DISCHARGE SUMMARY  Stephanie [name]  Patient's HYW:VPXT,GGYIRSWNI, MD  Consults: Neurosurgery Palliative Care  Disposition: d/c to [facility] for comfort focused care   Discharge Diagnoses: Bilateral subdural hematomas s/p fall  Acute encephalopathy Newly diagnosed Atrial fibrillation  Renal failure - acute v/s probable chronic kidney disease stage III Large facial hematoma from fall  Hypothyroidism  HTN Hypokalemia Hypomagnesemia LLL CAP   Initial presentation: 81 y.o. female w/ hx hypertension, hypothyroidism, breast cancer S/P right mastectomy who was brought to the ER after patient was found to have had a fall in a parking lot. Patient was found to have a large facial hematoma. CT head showed bilateral subdural hematomas along with a large facial hematoma. On-call Neurosurgeon Dr. Trenton Gammon was consulted and suggested conservative management. Patient's EKG showed atrial fibrillation.  Hospital Course:  Bilateral subdural hematoma  -status post fall  -managed conservatively as per Neurosurgery -pt improved somewhat in regard to alertness, but appeared to have significant neurologic injuries due to her fall - her long term prognosis is very poor - Palliative Care was asked to see in consultation -per Palliative Care "I still believe her prognosis is <2 weeks. She is declining rapidly now. She is going to require scheduled medication for agitation and safety. She does not follow commands. -scheduled haldol and ativan BID for seizure prophylaxis -continue low dose roxanol TID -explore hospice facility options -re-initiate referral" -[facility] was found to have an available bed, and her family agreed that this was the most appropriate - the pt was therefore transferred to [facility] for ongoing comfort focused care   Acute  encephalopathy -Secondary to SDH -as noted above, she became more alert but remained significantly confused   Newly diagnosed Atrial fibrillation  -rate controlled -not a candidate for anticoagulation secondary to SDH  Renal failure - acute v/s probable chronic kidney disease stage III -renal fxn vacillated but appeared to have stabilized around 1.3-1.5   Large facial hematoma from fall  -maxillofacial CT did not show any fractures  -cervical spine w/o clear acute injury so removed cervical collar   Hypothyroidism -focus on comfort from this point forward   HTN -will opt to avoid tx, with focus on comfort only    Hypomagnesemia -resolved as of last check - will stop checking labs and focus our efforts on her comfort   LLL CAP  -completed 3 days of abx tx - to transition to comfort directed care only     Medication List    STOP taking these medications        hydrochlorothiazide 25 MG tablet  Commonly known as:  HYDRODIURIL     levothyroxine 75 MCG tablet  Commonly known as:  SYNTHROID, LEVOTHROID      TAKE these medications        acetaminophen 325 MG tablet  Commonly known as:  TYLENOL  Take 2 tablets (650 mg total) by mouth every 6 (six) hours as needed for mild pain (or Fever >/= 101).     acetaminophen 650 MG suppository  Commonly known as:  TYLENOL  Place 1 suppository (650 mg total) rectally every 6 (six) hours as needed for mild pain (or Fever >/= 101).     haloperidol lactate 5 MG/ML injection  Commonly known as:  HALDOL  Inject 0.1-0.2 mLs (0.5-1 mg total) into the vein every 4 (four) hours.     latanoprost 0.005 %  ophthalmic solution  Commonly known as:  XALATAN  Place 1 drop into both eyes at bedtime.     LORazepam 2 MG/ML injection  Commonly known as:  ATIVAN  Inject 0.25-0.5 mLs (0.5-1 mg total) into the vein every 4 (four) hours as needed for anxiety, seizure or sedation.     metoprolol tartrate 25 mg/10 mL Susp  Commonly known  as:  LOPRESSOR  Take 10 mLs (25 mg total) by mouth 2 (two) times daily.     morphine 5 mg/mL Soln  Inject 2 mg into the vein every 30 (thirty) minutes as needed (Dyspnea, Distress, Pain).     morphine CONCENTRATE 10 MG/0.5ML Soln concentrated solution  Take 0.25 mLs (5 mg total) by mouth every 8 (eight) hours.     ondansetron 4 MG/2ML Soln injection  Commonly known as:  ZOFRAN  Inject 2 mLs (4 mg total) into the vein every 6 (six) hours as needed for nausea.     senna-docusate 8.6-50 MG per tablet  Commonly known as:  Senokot-S  Take 1 tablet by mouth at bedtime.       Day of Discharge BP 153/73 mmHg  Pulse 93  Temp(Src) 98.5 F (36.9 C) (Axillary)  Resp 19  Ht 5\' 2"  (1.575 m)  Wt 48.2 kg (106 lb 4.2 oz)  BMI 19.43 kg/m2  SpO2 93%  Physical Exam: General: alert - no acute resp distress  Lungs: Clear to auscultation bilaterally without wheezes or crackles Cardiovascular: Irregularly irregular without murmur gallop or rub  Abdomen: Nontender, nondistended, soft, bowel sounds positive, no rebound, no ascites, no appreciable mass Extremities: No significant cyanosis, clubbing, or edema bilateral lower extremities  Basic Metabolic Panel:  Recent Labs Lab 06/26/14 1731 06/26/14 1818 06/27/14 0336 06/27/14 1445 06/29/14 0322  NA 144 142 141  --  137  K 3.8 3.5 3.0* 3.4* 4.4  CL 105 106 105  --  104  CO2  --  25 29  --  24  GLUCOSE 103* 96 139*  --  159*  BUN 26* 23 19  --  22  CREATININE 1.30* 1.44* 1.36*  --  1.52*  CALCIUM  --  8.7 8.3*  --  9.0  MG  --   --   --  1.4* 2.0    Liver Function Tests:  Recent Labs Lab 06/27/14 0336 06/29/14 0322  AST 25 39*  ALT 15 21  ALKPHOS 65 71  BILITOT 0.9 1.0  PROT 5.0* 5.4*  ALBUMIN 3.0* 2.9*   Coags:  Recent Labs Lab 06/26/14 1720  INR 1.18   CBC:  Recent Labs Lab 06/26/14 1731 06/26/14 1818 06/27/14 0336  WBC  --  8.4 10.9*  NEUTROABS  --  6.8 9.8*  HGB 15.3* 12.9 11.6*  HCT 45.0 39.0 34.9*   MCV  --  89.2 89.7  PLT  --  217 208    Cardiac Enzymes:  Recent Labs Lab 06/27/14 0336 06/27/14 1006 06/27/14 1445  TROPONINI 0.04* 0.04* 0.03   CBG:  Recent Labs Lab 06/28/14 0413 06/28/14 0751 06/28/14 1159 06/28/14 1646  GLUCAP 88 79 76 138*    Recent Results (from the past 240 hour(s))  MRSA PCR Screening     Status: None   Collection Time: 06/27/14  5:46 AM  Result Value Ref Range Status   MRSA by PCR NEGATIVE NEGATIVE Final    Comment:        The GeneXpert MRSA Assay (FDA approved for NASAL specimens only), is one component of  a comprehensive MRSA colonization surveillance program. It is not intended to diagnose MRSA infection nor to guide or monitor treatment for MRSA infections.     Time spent in discharge (includes decision making & examination of pt): >35 minutes  07/03/2014, 2:44 PM   Cherene Altes, MD [facility] Office  [phone] Pager [phone]  On-Call/Text Page:      [email or web address]      password [facility]

## 2014-07-03 NOTE — Progress Notes (Signed)
Patient restless in bed and making sounds not appropriate for questions asked, not sure if patient able to understand questions. Patient incontinent of urine and throws legs over side of bed at all times. Patient changed and bathed. Linens changed and patient smiled when asked if she feels better. Bed alarm on. Due to restlessness will medicate with Ativan and see if patient can rest. VSS and patient not eating or drinking. Water given to patient with syringe. No family into see patient today. Will continue to monitor and try to access patient and her needs.

## 2014-07-03 NOTE — Progress Notes (Signed)
CSW (Clinical Education officer, museum) aware that pt is ready to transfer to [facility]. CSW arranged non-emergent ambulance. Pt brother-in-law made aware. CSW signing off.  [place], [place]

## 2014-07-03 NOTE — Progress Notes (Deleted)
[place] TEAM 1 - Stepdown/ICU TEAM Progress Note  Stephanie [name] ZOX:[id number] DOB: Feb 14, 1923 DOA: 06/26/2014 PCP: Mayra Neer, MD  Admit HPI / Brief Narrative: 81 y.o. female w/ hx hypertension, hypothyroidism, breast cancer S/P right mastectomy who was brought to the ER after patient was found to have had a fall in a parking lot. Patient was found to have a large facial hematoma. CT head showed bilateral subdural hematomas along with a large facial hematoma. On-call Neurosurgeon Dr. Trenton Gammon was consulted and suggested conservative management. Patient's EKG showed atrial fibrillation.  HPI/Subjective: The pt is interactive today.  She answers some questions appropriately.  She is not, however, able to provide a detailed ROS as she simply does not answer many questions.  There is no family present.  Her RN informs me that she is not eating and is making frequent attempts to pull off her tele and to get out of bed.    Assessment/Plan:  Bilateral subdural hematoma  -status post fall  -manage conservatively as per Neurosurgery -pt is improving in regard to alertness, but appears to have significant neurologic injuries due to her fall - her long term prognosis is very poor and I suspect Hospice care is the most appropriate, w/ a focus on her comfort - Palliative Care Team is following w/ [place]   Acute encephalopathy -Secondary to SDH -as noted above, she is now more alert but remains significantly confused   Newly diagnosed Atrial fibrillation  -rate controlled -not a candidate for anticoagulation secondary to SDH  Renal failure - acute v/s probable chronic kidney disease stage III -renal fxn is vacillating but appears to have stabilized around 1.3-1.5   Large facial hematoma from fall  -maxillofacial CT does not show any fractures.  -cervical spine w/o clear acute injury so removed cervical collar   Hypothyroidism -TSH 9.5 but FT4 is normal c/w subclinical hypothyroidism - most  patients w/ subclinical hypothyroidism are asymptomatic - per [employer] "the routine treatment of asymptomatic patients with TSH values between 4.5 and 10 remains controversial" -focus on comfort from this point forward     HTN -will opt to avoid tx, with focus on comfort only            Hypokalemia   Hypomagnesemia -resolved as of last check - will stop checking labs and focus our efforts on her comfort    ?LLL CAP  Completed 3 days of abx tx - to transition to comfort directed care only   Code Status: NO CODE - DNR Family Communication: no family present at time of exam today  Disposition Plan:  Transfer to medical bed for comfort directed care only - Palliative Care to ask Hospice to evaluate for [place]   Consultants: Neurosurgery Palliative Care  Procedure/Significant Events: 2/8 CT C-spine and CT head without contrast;Bilateral acute subdural hematomas, Rt > Lt . No significant mass effect or midline shift - Large soft tissue hematoma over the right side of the face - Slight anterior subluxation of C2 on C3 and C7 on T1, likely degenerative but ligamentous injury not excluded.  2/9 TTE - Left ventricle: Hypokinesis base inferior wall. LVEF=50%.-Right/Left atrium:  mildly dilated  Tricuspid valve: mild-moderate regurgitation - Pulmonary arteries: PA peak pressure: 35 mm Hg (S).  Antibiotics: Azithromycin 2/8 > 2/10 Ceftriaxone 2/8 > 2/10  DVT prophylaxis: Comfort focus only   Objective: Blood pressure 153/73, pulse 93, temperature 98.5 F (36.9 C), temperature source Axillary, resp. rate 33, height 5\' 2"  (1.575 m), weight 48.2 kg (  106 lb 4.2 oz), SpO2 93 %.  Intake/Output Summary (Last 24 hours) at 07/03/14 0920 Last data filed at 07/02/14 2210  Gross per 24 hour  Intake    320 ml  Output      0 ml  Net    320 ml   Exam: General: alert - no acute resp distress  Lungs: Clear to auscultation bilaterally without wheezes or crackles Cardiovascular: Irregularly  irregular without murmur gallop or rub  Abdomen: Nontender, nondistended, soft, bowel sounds positive, no rebound, no ascites, no appreciable mass Extremities: No significant cyanosis, clubbing, or edema bilateral lower extremities  Data Reviewed: Basic Metabolic Panel:  Recent Labs Lab 06/26/14 1731 06/26/14 1818 06/27/14 0336 06/27/14 1445 06/29/14 0322  NA 144 142 141  --  137  K 3.8 3.5 3.0* 3.4* 4.4  CL 105 106 105  --  104  CO2  --  25 29  --  24  GLUCOSE 103* 96 139*  --  159*  BUN 26* 23 19  --  22  CREATININE 1.30* 1.44* 1.36*  --  1.52*  CALCIUM  --  8.7 8.3*  --  9.0  MG  --   --   --  1.4* 2.0   Liver Function Tests:  Recent Labs Lab 06/27/14 0336 06/29/14 0322  AST 25 39*  ALT 15 21  ALKPHOS 65 71  BILITOT 0.9 1.0  PROT 5.0* 5.4*  ALBUMIN 3.0* 2.9*   CBC:  Recent Labs Lab 06/26/14 1731 06/26/14 1818 06/27/14 0336  WBC  --  8.4 10.9*  NEUTROABS  --  6.8 9.8*  HGB 15.3* 12.9 11.6*  HCT 45.0 39.0 34.9*  MCV  --  89.2 89.7  PLT  --  217 208   Cardiac Enzymes:  Recent Labs Lab 06/27/14 0336 06/27/14 1006 06/27/14 1445  TROPONINI 0.04* 0.04* 0.03   CBG:  Recent Labs Lab 06/28/14 0413 06/28/14 0751 06/28/14 1159 06/28/14 1646  GLUCAP 88 79 76 138*    Recent Results (from the past 240 hour(s))  MRSA PCR Screening     Status: None   Collection Time: 06/27/14  5:46 AM  Result Value Ref Range Status   MRSA by PCR NEGATIVE NEGATIVE Final    Comment:        The GeneXpert MRSA Assay (FDA approved for NASAL specimens only), is one component of a comprehensive MRSA colonization surveillance program. It is not intended to diagnose MRSA infection nor to guide or monitor treatment for MRSA infections.      Studies:  Recent x-ray studies have been reviewed in detail by the Attending Physician  Scheduled Meds:  Scheduled Meds: . acetaminophen  650 mg Oral TID  . antiseptic oral rinse  7 mL Mouth Rinse q12n4p  . chlorhexidine   15 mL Mouth Rinse BID  . latanoprost  1 drop Both Eyes QHS  . levothyroxine  100 mcg Oral QAC breakfast  . metoprolol tartrate  25 mg Oral BID  . morphine CONCENTRATE  5 mg Oral 3 times per day  . senna-docusate  1 tablet Oral QHS    Time spent on care of this patient: 35 mins  Cherene Altes, MD [facility] For Consults/Admissions - Flow Manager - [phone] Office  [phone]  Contact MD directly via text page:      [email or web address]      password [facility]  07/03/2014, 9:20 AM   LOS: 7 days

## 2014-07-06 DIAGNOSIS — I482 Chronic atrial fibrillation, unspecified: Secondary | ICD-10-CM | POA: Diagnosis present

## 2014-07-18 DEATH — deceased

## 2016-10-24 IMAGING — DX DG KNEE COMPLETE 4+V*R*
5 series · 5 of 5 positions shown · non-contrast
Comparison: None.

CLINICAL DATA: Fall today 06/26/2014.  RIGHT knee pain.

EXAM:
RIGHT KNEE - COMPLETE 4+ VIEW

[knee ap]
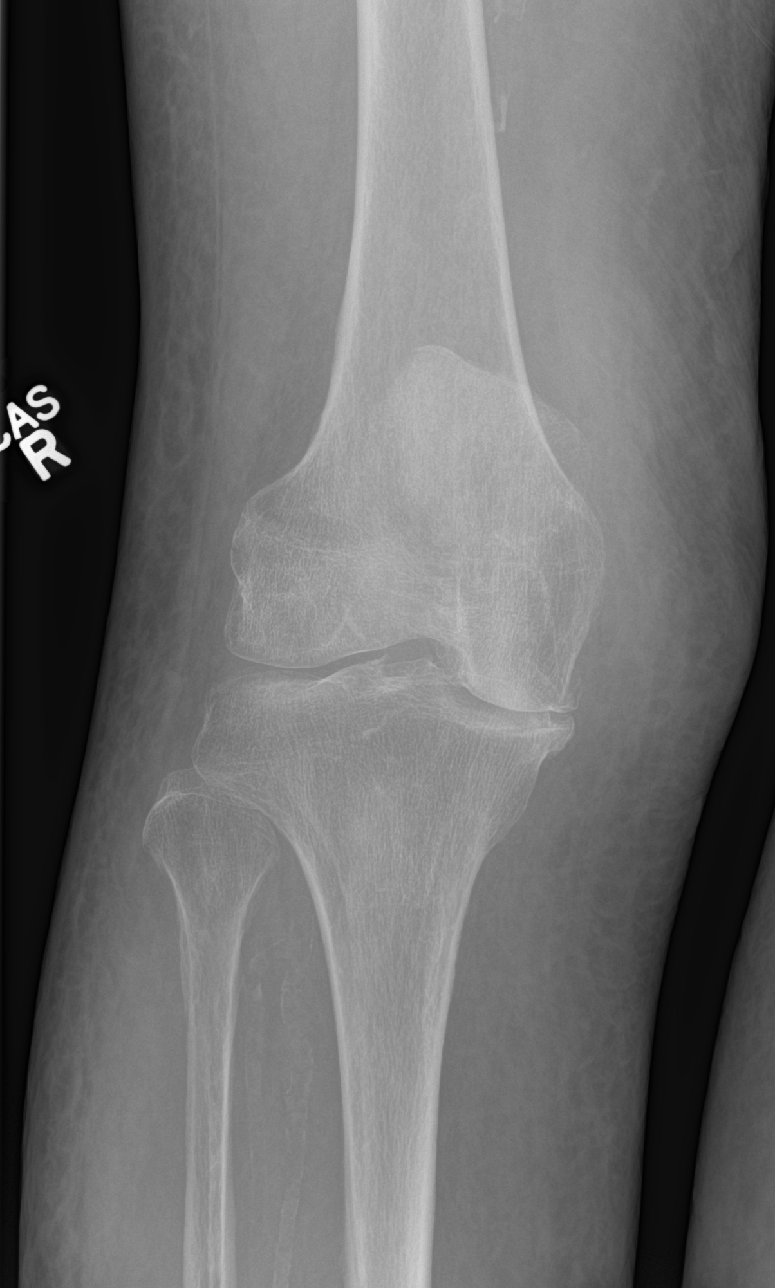

[knee obl (1 of 3)]
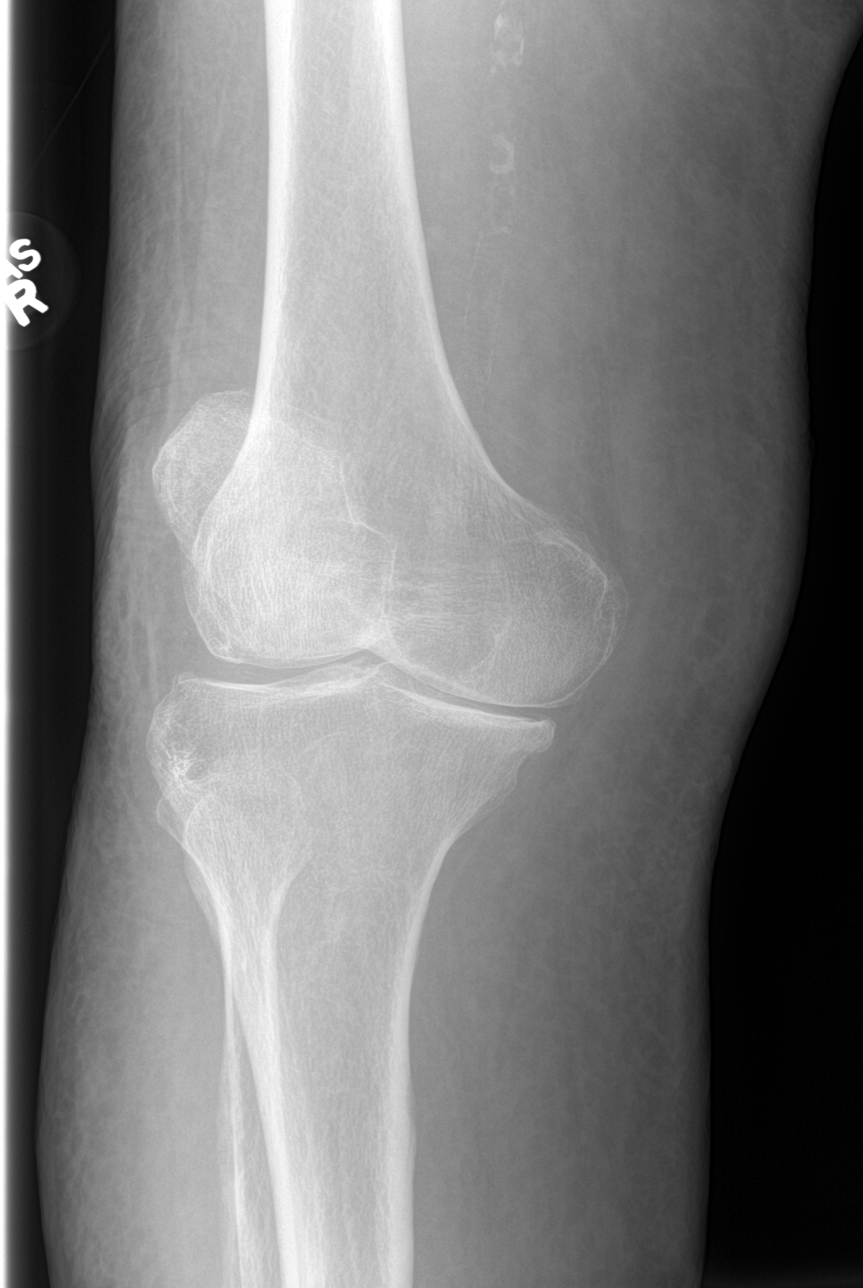

[knee obl (2 of 3)]
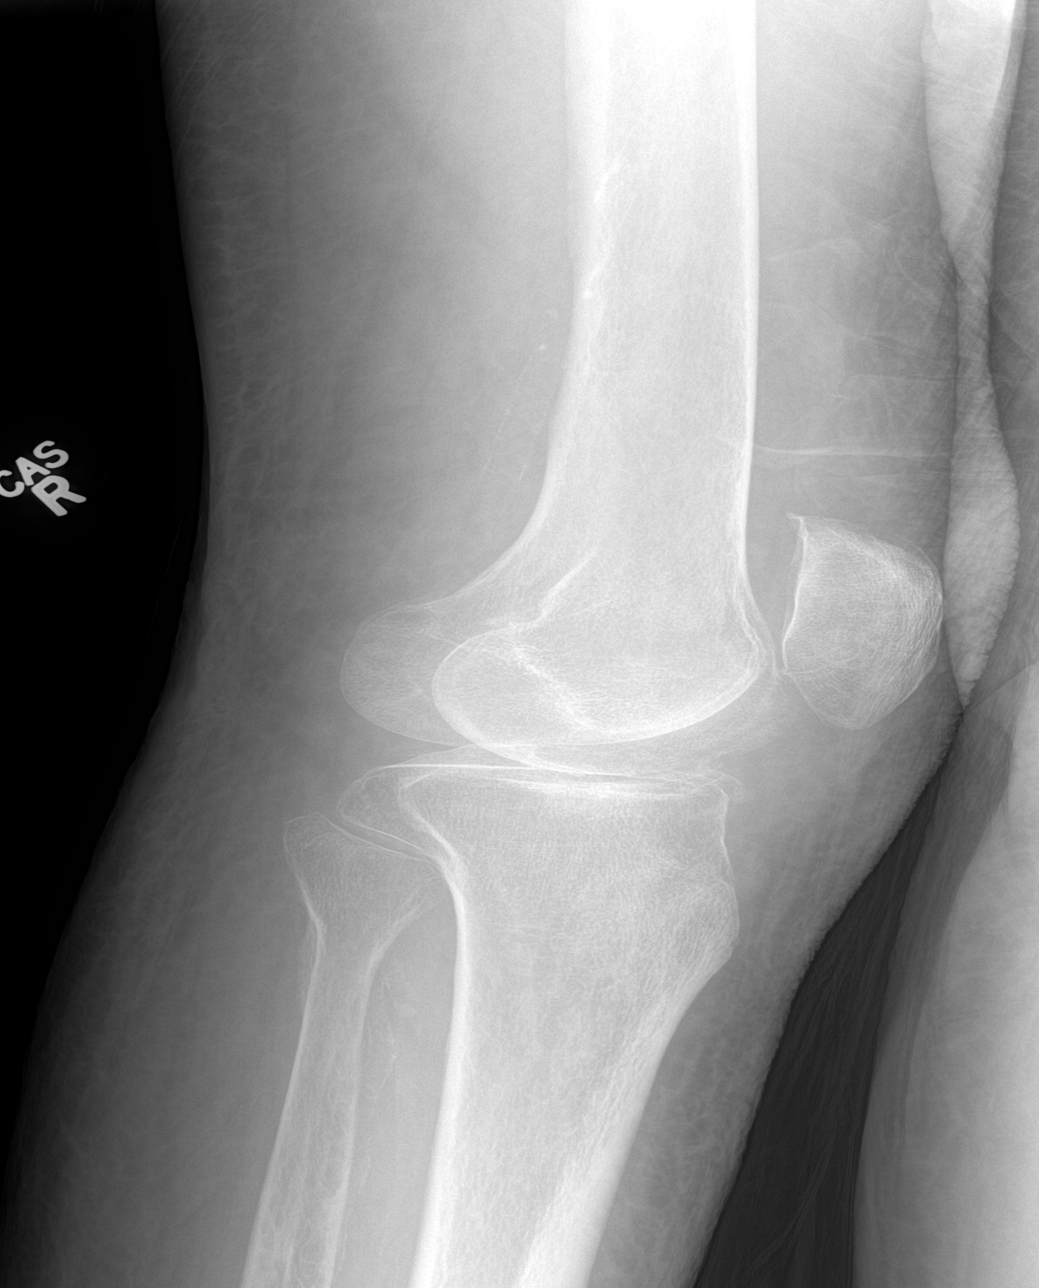

[knee obl (3 of 3)]
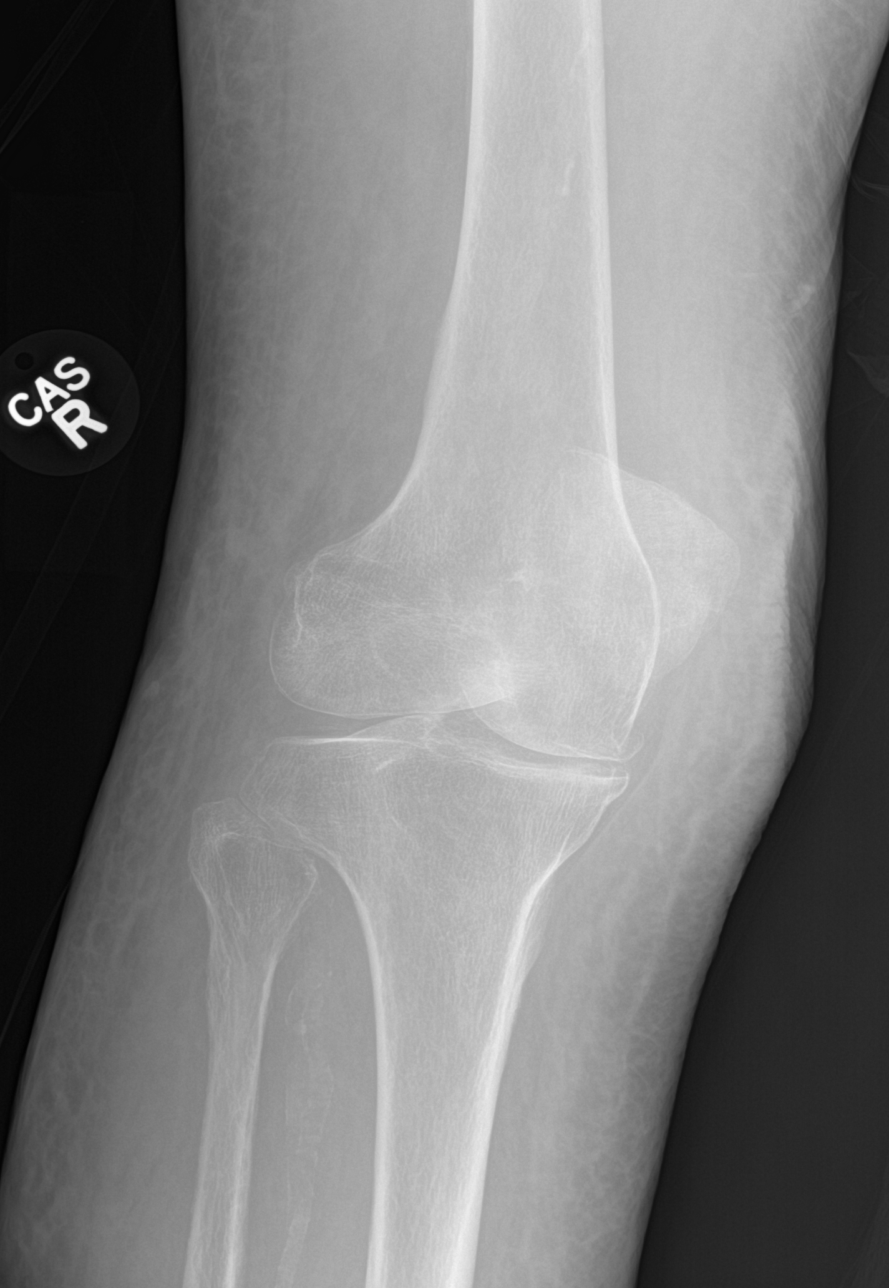

[knee lat]
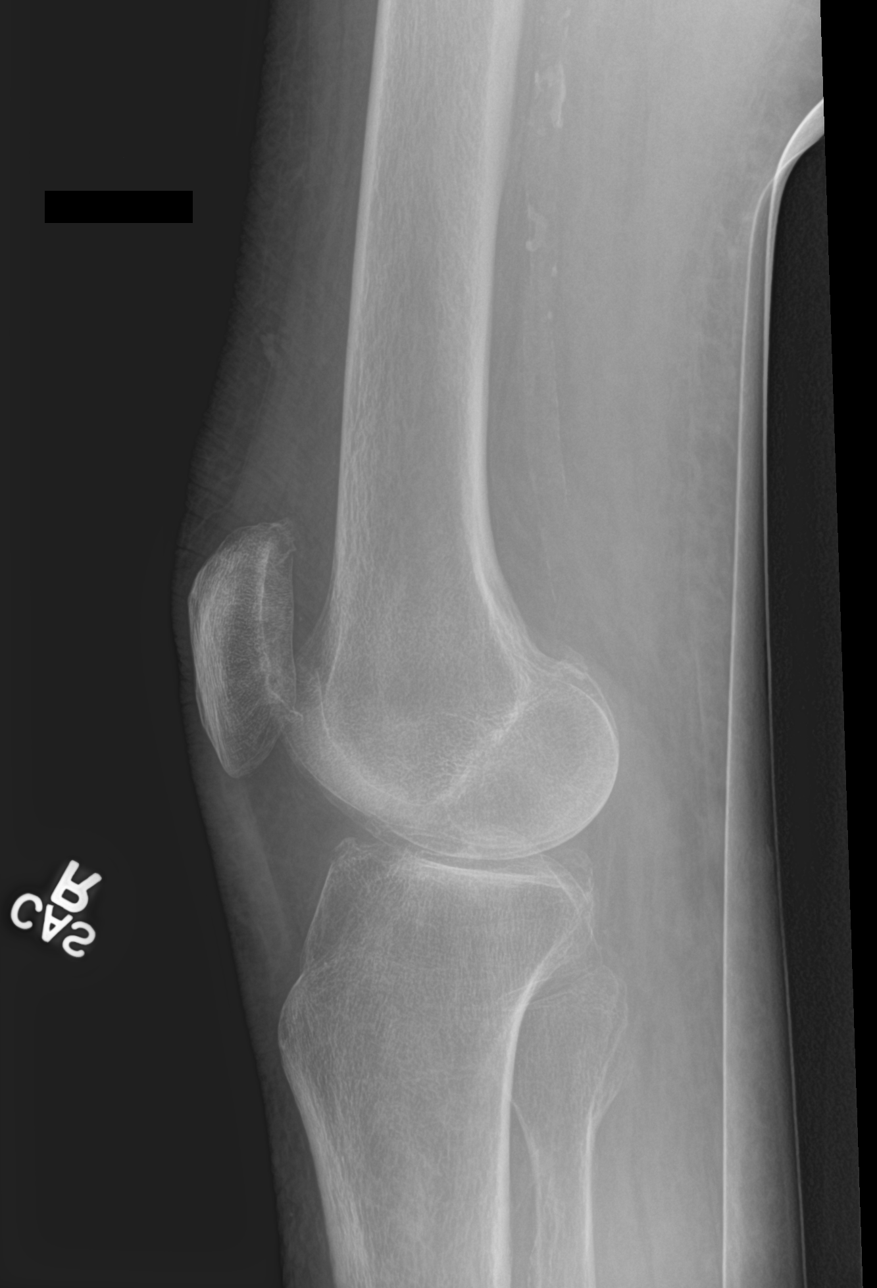

[5 of 5 positions shown; findings below may reference images not displayed]

FINDINGS: The patient was unable to be adequately positioned for standard
views. Nonstandard views degrade the study. Allowing for degradation
of the study, there is no fracture or acute osseous abnormality.
Medial and patellofemoral compartment predominant osteoarthritis is
present. Chondrocalcinosis of the menisci. Atherosclerosis. The
alignment of the knee appears anatomic. No effusion.
IMPRESSION: No acute osseous abnormality. Medial and patellofemoral compartment
osteoarthritis.
